# Patient Record
Sex: Female | Born: 1980 | Race: Black or African American | Hispanic: No | Marital: Married | Smoking: Never smoker
Health system: Southern US, Community
[De-identification: ages and names within clinical notes are randomized; demographics above are authoritative.]

## PROBLEM LIST (undated history)

## (undated) DIAGNOSIS — J069 Acute upper respiratory infection, unspecified: Secondary | ICD-10-CM

## (undated) DIAGNOSIS — Z789 Other specified health status: Secondary | ICD-10-CM

## (undated) DIAGNOSIS — O24419 Gestational diabetes mellitus in pregnancy, unspecified control: Secondary | ICD-10-CM

## (undated) HISTORY — DX: Acute upper respiratory infection, unspecified: J06.9

## (undated) HISTORY — DX: Other specified health status: Z78.9

---

## 2003-11-21 DIAGNOSIS — O321XX0 Maternal care for breech presentation, not applicable or unspecified: Secondary | ICD-10-CM

## 2014-12-21 NOTE — L&D Delivery Note (Addendum)
Delivery Note [phone]: Nurse call reports patient with involuntary pushing.  In room to assess and exam C/C/+3. 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 Patient encouraged to continue pushing and after assessment of vaginal outlet and discussion with husband, it was determined that labial separation was necessary for delivery.  Local anesthetic injected and ML incision of circumcision resulted in labia majora separation and patient delivered as below.  At 8:02 AM, on December 01, 2015, a viable female "Rebecca Carlson" was delivered via Vaginal, Spontaneous Delivery (Presentation: Left Occiput Anterior with restitution to LOT).  After delivery of head, nuchal cord noted that shoulders and body was delivered through via somersault maneuver.  Further assessment of infant  revealed a body cord and a loose knot. Infant with spontaneous grimace and good tone and provider gave tactile stimulation prior to placing on mother's abdomen.  Once in place, nurses continued tactile stimulation and infant  APGAR: 9, 9. Cord clamped, cut, and blood collected. Placenta delivered spontaneously and noted to be intact with 3VC upon inspection.  Vaginal inspection revealed no lacerations, but female circumcision repaired per patient's request.  Repair made with 3-0 vicryl on SH and addition of 1% lidocaine for local anesthetic. Patient tolerated procedure well and was straight catheterized prior to completion of repair.  Fundus firm, below the umbilicus, and bleeding small.  Mother hemodynamically stable and infant at the breast prior to provider exit.  Mother desires IUD for birth control method and opts to breastfeed.  Infant weight at one hour of life: 7 lb 9.2 oz (3435 g).    Anesthesia: Epidural  Episiotomy: Median Incision of Female Circumcision Lacerations: None Suture Repair: 3.0 vicryl Est. Blood Loss (mL):  120  Mom to postpartum.  Baby to Couplet care / Skin to Skin.  Cherre RobinsJessica L Kareem Aul MSN, CNM 12/01/2015, 8:43 AM

## 2015-05-30 LAB — OB RESULTS CONSOLE GC/CHLAMYDIA
Chlamydia: NEGATIVE
Gonorrhea: NEGATIVE

## 2015-05-30 LAB — OB RESULTS CONSOLE RPR: RPR: NONREACTIVE

## 2015-05-30 LAB — OB RESULTS CONSOLE HIV ANTIBODY (ROUTINE TESTING): HIV: NONREACTIVE

## 2015-05-30 LAB — OB RESULTS CONSOLE RUBELLA ANTIBODY, IGM: Rubella: IMMUNE

## 2015-05-30 LAB — OB RESULTS CONSOLE ABO/RH: RH Type: POSITIVE

## 2015-05-30 LAB — OB RESULTS CONSOLE HEPATITIS B SURFACE ANTIGEN: Hepatitis B Surface Ag: NEGATIVE

## 2015-05-30 LAB — OB RESULTS CONSOLE ANTIBODY SCREEN: ANTIBODY SCREEN: NEGATIVE

## 2015-11-07 ENCOUNTER — Encounter (HOSPITAL_COMMUNITY): Payer: Self-pay

## 2015-11-07 ENCOUNTER — Emergency Department (HOSPITAL_COMMUNITY)
Admission: EM | Admit: 2015-11-07 | Discharge: 2015-11-07 | Disposition: A | Discharge status: Home / Self Care | Payer: Medicaid Other | Attending: Emergency Medicine | Admitting: Emergency Medicine

## 2015-11-07 DIAGNOSIS — H538 Other visual disturbances: Secondary | ICD-10-CM | POA: Insufficient documentation

## 2015-11-07 DIAGNOSIS — O9989 Other specified diseases and conditions complicating pregnancy, childbirth and the puerperium: Secondary | ICD-10-CM | POA: Insufficient documentation

## 2015-11-07 DIAGNOSIS — Z79899 Other long term (current) drug therapy: Secondary | ICD-10-CM | POA: Diagnosis not present

## 2015-11-07 DIAGNOSIS — R519 Headache, unspecified: Secondary | ICD-10-CM

## 2015-11-07 DIAGNOSIS — H53149 Visual discomfort, unspecified: Secondary | ICD-10-CM | POA: Insufficient documentation

## 2015-11-07 DIAGNOSIS — R51 Headache: Secondary | ICD-10-CM | POA: Insufficient documentation

## 2015-11-07 DIAGNOSIS — O2343 Unspecified infection of urinary tract in pregnancy, third trimester: Secondary | ICD-10-CM | POA: Insufficient documentation

## 2015-11-07 DIAGNOSIS — Z3A32 32 weeks gestation of pregnancy: Secondary | ICD-10-CM | POA: Insufficient documentation

## 2015-11-07 LAB — URINE MICROSCOPIC-ADD ON

## 2015-11-07 LAB — COMPREHENSIVE METABOLIC PANEL
ALBUMIN: 2.5 g/dL — AB (ref 3.5–5.0)
ALK PHOS: 103 U/L (ref 38–126)
ALT: 10 U/L — AB (ref 14–54)
AST: 18 U/L (ref 15–41)
Anion gap: 7 (ref 5–15)
BILIRUBIN TOTAL: 0.5 mg/dL (ref 0.3–1.2)
CALCIUM: 8.6 mg/dL — AB (ref 8.9–10.3)
CO2: 20 mmol/L — AB (ref 22–32)
CREATININE: 0.55 mg/dL (ref 0.44–1.00)
Chloride: 108 mmol/L (ref 101–111)
GFR calc Af Amer: >60 mL/min (ref >60)
GFR calc non Af Amer: >60 mL/min (ref >60)
GLUCOSE: 89 mg/dL (ref 65–99)
Potassium: 3.9 mmol/L (ref 3.5–5.1)
SODIUM: 135 mmol/L (ref 135–145)
TOTAL PROTEIN: 5.6 g/dL — AB (ref 6.5–8.1)

## 2015-11-07 LAB — CBC WITH DIFFERENTIAL/PLATELET
BASOS PCT: 0 %
Basophils Absolute: 0 10*3/uL (ref 0.0–0.1)
EOS ABS: 0.1 10*3/uL (ref 0.0–0.7)
EOS PCT: 1 %
HCT: 31.1 % — ABNORMAL LOW (ref 36.0–46.0)
Hemoglobin: 10.5 g/dL — ABNORMAL LOW (ref 12.0–15.0)
Lymphocytes Relative: 25 %
Lymphs Abs: 2 10*3/uL (ref 0.7–4.0)
MCH: 29.3 pg (ref 26.0–34.0)
MCHC: 33.8 g/dL (ref 30.0–36.0)
MCV: 86.9 fL (ref 78.0–100.0)
MONO ABS: 0.5 10*3/uL (ref 0.1–1.0)
MONOS PCT: 6 %
NEUTROS ABS: 5.4 10*3/uL (ref 1.7–7.7)
Neutrophils Relative %: 68 %
PLATELETS: 171 10*3/uL (ref 150–400)
RBC: 3.58 MIL/uL — AB (ref 3.87–5.11)
RDW: 13.4 % (ref 11.5–15.5)
WBC: 7.9 10*3/uL (ref 4.0–10.5)

## 2015-11-07 LAB — URINALYSIS, ROUTINE W REFLEX MICROSCOPIC
BILIRUBIN URINE: NEGATIVE
Glucose, UA: NEGATIVE mg/dL
Hgb urine dipstick: NEGATIVE
Ketones, ur: NEGATIVE mg/dL
NITRITE: NEGATIVE
PROTEIN: NEGATIVE mg/dL
Specific Gravity, Urine: 1.006 (ref 1.005–1.030)
pH: 6.5 (ref 5.0–8.0)

## 2015-11-07 MED ORDER — METOCLOPRAMIDE HCL 5 MG/ML IJ SOLN
10.0000 mg | Freq: Once | INTRAMUSCULAR | Status: AC
  Administered 2015-11-07: 10 mg via INTRAVENOUS
  Filled 2015-11-07: qty 2

## 2015-11-07 MED ORDER — SODIUM CHLORIDE 0.9 % IV BOLUS (SEPSIS)
1000.0000 mL | Freq: Once | INTRAVENOUS | Status: AC
  Administered 2015-11-07: 1000 mL via INTRAVENOUS

## 2015-11-07 MED ORDER — PROMETHAZINE HCL 25 MG PO TABS: 12.5000 mg | ORAL_TABLET | Freq: Four times a day (QID) | ORAL | Status: DC | PRN

## 2015-11-07 MED ORDER — DIPHENHYDRAMINE HCL 50 MG/ML IJ SOLN
25.0000 mg | Freq: Once | INTRAMUSCULAR | Status: AC
  Administered 2015-11-07: 25 mg via INTRAVENOUS
  Filled 2015-11-07: qty 1

## 2015-11-07 MED ORDER — NITROFURANTOIN MONOHYD MACRO 100 MG PO CAPS: 100.0000 mg | ORAL_CAPSULE | Freq: Two times a day (BID) | ORAL | Status: DC

## 2015-11-07 NOTE — ED Notes (Signed)
Pt c/o headache most noted at right eye x 10 days. Pt is 8 months pregnant. Pt arrived from [place] 9 months ago and communicates through family per her custom.

## 2015-11-07 NOTE — Discharge Instructions (Signed)

## 2015-11-07 NOTE — ED Provider Notes (Signed)
CSN: [id number]     Arrival date & time 11/07/15  1041 History   First MD Initiated Contact with Patient 11/07/15 1057     Chief Complaint  Patient presents with  . Headache     (Consider location/radiation/quality/duration/timing/severity/associated sxs/prior Treatment) Patient is a 34 y.o. female presenting with headaches. The history is provided by the patient and a relative.  Headache Pain location:  R temporal Quality:  Dull Radiates to:  Does not radiate Severity currently:  Unable to specify Severity at highest:  Unable to specify Onset quality:  Gradual Duration:  10 days Timing:  Constant Progression:  Waxing and waning Chronicity:  Recurrent Similar to prior headaches: yes   Context: activity, bright light and loud noise   Context: not stress   Relieved by:  Nothing Worsened by:  Light, sound and activity Associated symptoms: blurred vision, facial pain, photophobia and visual change   Associated symptoms: no abdominal pain, no back pain, no congestion, no cough, no dizziness, no eye pain, no fatigue, no fever, no focal weakness, no hearing loss, no nausea, no near-syncope, no neck pain, no sore throat, no swollen glands, no syncope, no tingling, no vomiting and no weakness     History reviewed. No pertinent past medical history. Past Surgical History  Procedure Laterality Date  . Cesarean section     Family History  Problem Relation Age of Onset  . Diabetes Sister   . Hypertension Sister    Social History  Substance Use Topics  . Smoking status: None  . Smokeless tobacco: None  . Alcohol Use: No   OB History    Gravida Para Term Preterm AB TAB SAB Ectopic Multiple Living   6 4             Review of Systems  Constitutional: Negative for fever and fatigue.  HENT: Negative for congestion, facial swelling, hearing loss and sore throat.   Eyes: Positive for blurred vision and photophobia. Negative for pain.  Respiratory: Negative for cough and shortness  of breath.   Cardiovascular: Negative for chest pain, syncope and near-syncope.  Gastrointestinal: Negative for nausea, vomiting and abdominal pain.  Genitourinary: Negative for dysuria and vaginal bleeding.  Musculoskeletal: Negative for back pain and neck pain.  Skin: Negative for rash.  Neurological: Positive for headaches. Negative for dizziness, focal weakness and weakness.  Psychiatric/Behavioral: Negative for confusion.      Allergies  Review of patient's allergies indicates no known allergies.  Home Medications   Prior to Admission medications   Medication Sig Start Date End Date Taking? Authorizing Provider  cyclobenzaprine (FLEXERIL) 10 MG tablet Take 10 mg by mouth 3 (three) times daily as needed for muscle spasms.   Yes Historical Provider, MD  Prenatal Vit-Fe Fumarate-FA (MULTIVITAMIN-PRENATAL) 27-0.8 MG TABS tablet Take 1 tablet by mouth daily at 12 noon.   Yes Historical Provider, MD  nitrofurantoin, macrocrystal-monohydrate, (MACROBID) 100 MG capsule Take 1 capsule (100 mg total) by mouth 2 (two) times daily. 11/07/15   Gavin Pound, MD  promethazine (PHENERGAN) 25 MG tablet Take 0.5-1 tablets (12.5-25 mg total) by mouth every 6 (six) hours as needed for refractory nausea / vomiting (headache). 11/07/15   Gavin Pound, MD   BP 112/80 mmHg  Pulse 90  Temp(Src) 98.3 F (36.8 C) (Oral)  Ht  (1.676 m)  Wt 140 lb (63.504 kg)  BMI 22.61 kg/m2  SpO2 98%  LMP 03/03/2015 Physical Exam  Constitutional: She is oriented to person, place, and time. She appears  well-developed and well-nourished. No distress.  HENT:  Head: Normocephalic and atraumatic.  Right Ear: External ear normal.  Left Ear: External ear normal.  Nose: Nose normal.  Mouth/Throat: Oropharynx is clear and moist. No oropharyngeal exudate.  Eyes: Conjunctivae and EOM are normal. Pupils are equal, round, and reactive to light. Right eye exhibits no discharge. Left eye exhibits no discharge. No  scleral icterus.  Neck: Normal range of motion. Neck supple. No JVD present. No tracheal deviation present. No thyromegaly present.  Cardiovascular: Normal rate, regular rhythm and intact distal pulses.  Exam reveals no gallop and no friction rub.   No murmur heard. Pulmonary/Chest: Effort normal and breath sounds normal. No stridor. No respiratory distress. She has no wheezes. She has no rales. She exhibits no tenderness.  Abdominal: Soft. She exhibits no distension. There is no tenderness.  Musculoskeletal: Normal range of motion. She exhibits no edema or tenderness.  Lymphadenopathy:    She has no cervical adenopathy.  Neurological: She is alert and oriented to person, place, and time. She has normal strength. She displays no atrophy and no tremor. No cranial nerve deficit or sensory deficit. She exhibits normal muscle tone. She displays no seizure activity. Coordination and gait normal. GCS eye subscore is 4. GCS verbal subscore is 5. GCS motor subscore is 6.  Skin: Skin is warm and dry. No rash noted. She is not diaphoretic. No erythema. No pallor.  Psychiatric: She has a normal mood and affect. Her behavior is normal. Judgment and thought content normal.  Nursing note and vitals reviewed.   ED Course  Procedures (including critical care time) Labs Review Labs Reviewed  COMPREHENSIVE METABOLIC PANEL - Abnormal; Notable for the following:    CO2 20 (*)    BUN <5 (*)    Calcium 8.6 (*)    Total Protein 5.6 (*)    Albumin 2.5 (*)    ALT 10 (*)    All other components within normal limits  CBC WITH DIFFERENTIAL/PLATELET - Abnormal; Notable for the following:    RBC 3.58 (*)    Hemoglobin 10.5 (*)    HCT 31.1 (*)    All other components within normal limits  URINALYSIS, ROUTINE W REFLEX MICROSCOPIC (NOT AT [facility]) - Abnormal; Notable for the following:    Leukocytes, UA TRACE (*)    All other components within normal limits  URINE MICROSCOPIC-ADD ON - Abnormal; Notable for the  following:    Squamous Epithelial / LPF 6-30 (*)    Bacteria, UA MANY (*)    All other components within normal limits    Imaging Review No results found. I have personally reviewed and evaluated these images and lab results as part of my medical decision-making.   EKG Interpretation None      MDM   Final diagnoses:  Nonintractable headache, unspecified chronicity pattern, unspecified headache type  Urinary tract infection during pregnancy, third trimester   Rebecca Carlson is a 34 y.o. female patient presenting with HA x 10 days.  Pt denies any other complaints at this time.  Pt is not sure of exact pregnancy dates but is due in December.  Currently receiving regular prenatal care.  Pt is a G5P4.  Pt has hx of prior HAs.  This one has been more prolonged and has not responded to flexeril, which she received from PCP.  No focal neurologic deficits.  No HTN.  No RUQ TTP.  Pt was given MG cocktail with resolution of her symptoms.  Pt has  bactiuria on UA.  No significant lab abnormalities other than mild reduction in bicarbonate.  No significant acidosis, no AG, normal blood glucose.  Will Rx for phenergan for refractory HA.  Also macrobid BID x 7 days.  Patient was given return precautions for HA and UTI in pregnancy.  Pt advised on use of medications as applicable.  Advised to return for actely worsening symptoms, inability to take medications, or other acute concerns.  Advised to follow up with primary OB in 1 week.  Patient was in agreement with and expressed understanding of follow plan, plan of care, and return precautions.  All questions answered prior to discharge.  Patient was discharged in stable condition with family, ambulating without difficulty.  Patient care was discussed with my attending, Dr. Patria Mane.       Gavin Pound, MD 11/08/15 1610  Azalia Bilis, MD 11/08/15 [phone]

## 2015-11-11 LAB — OB RESULTS CONSOLE GBS: STREP GROUP B AG: NEGATIVE

## 2015-11-11 LAB — OB RESULTS CONSOLE GC/CHLAMYDIA
CHLAMYDIA, DNA PROBE: NEGATIVE
Gonorrhea: NEGATIVE

## 2015-12-01 ENCOUNTER — Inpatient Hospital Stay (HOSPITAL_COMMUNITY)
Admission: AD | Admit: 2015-12-01 | Discharge: 2015-12-03 | DRG: 775 | Disposition: A | Discharge status: Home / Self Care | Payer: Medicaid Other | Source: Ambulatory Visit | Attending: Obstetrics and Gynecology | Admitting: Obstetrics and Gynecology

## 2015-12-01 ENCOUNTER — Inpatient Hospital Stay (HOSPITAL_COMMUNITY): Payer: Medicaid Other | Admitting: Anesthesiology

## 2015-12-01 ENCOUNTER — Encounter (HOSPITAL_COMMUNITY): Payer: Self-pay | Admitting: *Deleted

## 2015-12-01 DIAGNOSIS — O3483 Maternal care for other abnormalities of pelvic organs, third trimester: Secondary | ICD-10-CM | POA: Diagnosis present

## 2015-12-01 DIAGNOSIS — Z98891 History of uterine scar from previous surgery: Secondary | ICD-10-CM

## 2015-12-01 DIAGNOSIS — O34219 Maternal care for unspecified type scar from previous cesarean delivery: Secondary | ICD-10-CM | POA: Diagnosis present

## 2015-12-01 DIAGNOSIS — Z3A39 39 weeks gestation of pregnancy: Secondary | ICD-10-CM | POA: Diagnosis not present

## 2015-12-01 DIAGNOSIS — N90810 Female genital mutilation status, unspecified: Secondary | ICD-10-CM | POA: Diagnosis present

## 2015-12-01 LAB — CBC
HEMATOCRIT: 36.2 % (ref 36.0–46.0)
HEMOGLOBIN: 12.2 g/dL (ref 12.0–15.0)
MCH: 29.3 pg (ref 26.0–34.0)
MCHC: 33.7 g/dL (ref 30.0–36.0)
MCV: 87 fL (ref 78.0–100.0)
Platelets: 177 10*3/uL (ref 150–400)
RBC: 4.16 MIL/uL (ref 3.87–5.11)
RDW: 14.3 % (ref 11.5–15.5)
WBC: 9.3 10*3/uL (ref 4.0–10.5)

## 2015-12-01 LAB — RPR: RPR: NONREACTIVE

## 2015-12-01 LAB — TYPE AND SCREEN
ABO/RH(D): A POS
ANTIBODY SCREEN: NEGATIVE

## 2015-12-01 LAB — ABO/RH: ABO/RH(D): A POS

## 2015-12-01 MED ORDER — ONDANSETRON HCL 4 MG/2ML IJ SOLN: 4.0000 mg | INTRAMUSCULAR | Status: DC | PRN

## 2015-12-01 MED ORDER — ZOLPIDEM TARTRATE 5 MG PO TABS: 5.0000 mg | ORAL_TABLET | Freq: Every evening | ORAL | Status: DC | PRN

## 2015-12-01 MED ORDER — EPHEDRINE 5 MG/ML INJ
10.0000 mg | INTRAVENOUS | Status: DC | PRN
  Filled 2015-12-01: qty 2

## 2015-12-01 MED ORDER — PHENYLEPHRINE 40 MCG/ML (10ML) SYRINGE FOR IV PUSH (FOR BLOOD PRESSURE SUPPORT)
PREFILLED_SYRINGE | INTRAVENOUS | Status: AC
  Filled 2015-12-01: qty 20

## 2015-12-01 MED ORDER — TETANUS-DIPHTH-ACELL PERTUSSIS 5-2.5-18.5 LF-MCG/0.5 IM SUSP: 0.5000 mL | Freq: Once | INTRAMUSCULAR | Status: DC

## 2015-12-01 MED ORDER — LACTATED RINGERS IV SOLN
500.0000 mL | INTRAVENOUS | Status: DC | PRN
  Administered 2015-12-01: 500 mL via INTRAVENOUS

## 2015-12-01 MED ORDER — ACETAMINOPHEN 325 MG PO TABS: 650.0000 mg | ORAL_TABLET | ORAL | Status: DC | PRN

## 2015-12-01 MED ORDER — SIMETHICONE 80 MG PO CHEW: 80.0000 mg | CHEWABLE_TABLET | ORAL | Status: DC | PRN

## 2015-12-01 MED ORDER — DIBUCAINE 1 % RE OINT: 1.0000 "application " | TOPICAL_OINTMENT | RECTAL | Status: DC | PRN

## 2015-12-01 MED ORDER — OXYCODONE-ACETAMINOPHEN 5-325 MG PO TABS: 2.0000 | ORAL_TABLET | ORAL | Status: DC | PRN

## 2015-12-01 MED ORDER — PRENATAL MULTIVITAMIN CH
1.0000 | ORAL_TABLET | Freq: Every day | ORAL | Status: DC
  Administered 2015-12-02 – 2015-12-03 (×2): 1 via ORAL
  Filled 2015-12-01 (×2): qty 1

## 2015-12-01 MED ORDER — ONDANSETRON HCL 4 MG/2ML IJ SOLN: 4.0000 mg | Freq: Four times a day (QID) | INTRAMUSCULAR | Status: DC | PRN

## 2015-12-01 MED ORDER — PHENYLEPHRINE 40 MCG/ML (10ML) SYRINGE FOR IV PUSH (FOR BLOOD PRESSURE SUPPORT)
80.0000 ug | PREFILLED_SYRINGE | INTRAVENOUS | Status: DC | PRN
  Filled 2015-12-01: qty 2

## 2015-12-01 MED ORDER — DIPHENHYDRAMINE HCL 25 MG PO CAPS: 25.0000 mg | ORAL_CAPSULE | Freq: Four times a day (QID) | ORAL | Status: DC | PRN

## 2015-12-01 MED ORDER — SENNOSIDES-DOCUSATE SODIUM 8.6-50 MG PO TABS
2.0000 | ORAL_TABLET | ORAL | Status: DC
  Administered 2015-12-01 – 2015-12-02 (×2): 2 via ORAL
  Filled 2015-12-01 (×2): qty 2

## 2015-12-01 MED ORDER — CITRIC ACID-SODIUM CITRATE 334-500 MG/5ML PO SOLN: 30.0000 mL | ORAL | Status: DC | PRN

## 2015-12-01 MED ORDER — LANOLIN HYDROUS EX OINT: TOPICAL_OINTMENT | CUTANEOUS | Status: DC | PRN

## 2015-12-01 MED ORDER — FENTANYL 2.5 MCG/ML BUPIVACAINE 1/10 % EPIDURAL INFUSION (WH - ANES)
14.0000 mL/h | INTRAMUSCULAR | Status: DC | PRN
  Administered 2015-12-01: 12.5 mL/h via EPIDURAL

## 2015-12-01 MED ORDER — OXYTOCIN BOLUS FROM INFUSION
500.0000 mL | INTRAVENOUS | Status: DC
  Administered 2015-12-01: 500 mL via INTRAVENOUS

## 2015-12-01 MED ORDER — BENZOCAINE-MENTHOL 20-0.5 % EX AERO
1.0000 "application " | INHALATION_SPRAY | CUTANEOUS | Status: DC | PRN
  Filled 2015-12-01: qty 56

## 2015-12-01 MED ORDER — OXYCODONE-ACETAMINOPHEN 5-325 MG PO TABS: 1.0000 | ORAL_TABLET | ORAL | Status: DC | PRN

## 2015-12-01 MED ORDER — LIDOCAINE HCL (PF) 1 % IJ SOLN
30.0000 mL | INTRAMUSCULAR | Status: AC | PRN
  Administered 2015-12-01: 30 mL via SUBCUTANEOUS
  Filled 2015-12-01: qty 30

## 2015-12-01 MED ORDER — FENTANYL 2.5 MCG/ML BUPIVACAINE 1/10 % EPIDURAL INFUSION (WH - ANES)
INTRAMUSCULAR | Status: AC
  Filled 2015-12-01: qty 125

## 2015-12-01 MED ORDER — WITCH HAZEL-GLYCERIN EX PADS: 1.0000 "application " | MEDICATED_PAD | CUTANEOUS | Status: DC | PRN

## 2015-12-01 MED ORDER — DIPHENHYDRAMINE HCL 50 MG/ML IJ SOLN: 12.5000 mg | INTRAMUSCULAR | Status: DC | PRN

## 2015-12-01 MED ORDER — FLEET ENEMA 7-19 GM/118ML RE ENEM: 1.0000 | ENEMA | RECTAL | Status: DC | PRN

## 2015-12-01 MED ORDER — OXYTOCIN 40 UNITS IN LACTATED RINGERS INFUSION - SIMPLE MED
62.5000 mL/h | INTRAVENOUS | Status: DC
  Filled 2015-12-01: qty 1000

## 2015-12-01 MED ORDER — LACTATED RINGERS IV SOLN
INTRAVENOUS | Status: DC
  Administered 2015-12-01: 04:00:00 via INTRAVENOUS

## 2015-12-01 MED ORDER — IBUPROFEN 600 MG PO TABS
600.0000 mg | ORAL_TABLET | Freq: Four times a day (QID) | ORAL | Status: DC
  Administered 2015-12-01 – 2015-12-03 (×8): 600 mg via ORAL
  Filled 2015-12-01 (×8): qty 1

## 2015-12-01 MED ORDER — ONDANSETRON HCL 4 MG PO TABS
4.0000 mg | ORAL_TABLET | ORAL | Status: DC | PRN
Start: 2015-12-01 — End: 2015-12-03

## 2015-12-01 MED ORDER — LIDOCAINE HCL (PF) 1 % IJ SOLN
INTRAMUSCULAR | Status: DC | PRN
  Administered 2015-12-01: 3 mL via EPIDURAL
  Administered 2015-12-01: 4 mL via EPIDURAL

## 2015-12-01 NOTE — Progress Notes (Signed)
Subjective: C/o increasing pressure but does not have the urge to push.   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

## 2015-12-01 NOTE — Progress Notes (Signed)
Rebecca Carlson, CNM notified of RN's inability to locate urethra for I/O cath insertion. Bladder assessed and is not distended. Pt last voided at 0420

## 2015-12-01 NOTE — MAU Note (Signed)
Contractions since 0200. Denies LOF or bleeding. First del C/S and the rest SVDs

## 2015-12-01 NOTE — Progress Notes (Signed)
Pt arrived safely via w/c from MAU to 163. Pt ambulated to BR safely, void of urine noted. Pt assisted to bed, EFM applied to soft abd, FHR located in LLQ at 145 BPM. Pt's accompanied by Spouse and does not speak [place]. Spouse will interpret per pt's request and interpreter consent has been signed by spouse, Osama Alvadol.

## 2015-12-01 NOTE — Progress Notes (Signed)
Subjective: Feeling nauseous but feeling more comfortable with epidural. Does not c/o pressure at this time.   Objective: BP 113/71 mmHg  Pulse 81  Temp(Src) 98.2 F (36.8 C) (Oral)  Resp 18  Ht 5' 2.5" (1.588 m)  Wt 56.518 kg (124 lb 9.6 oz)  BMI 22.41 kg/m2  SpO2 99%      FHT:  Overall Cat 1 currently Cat 2  S/p Epidural, 135 bpm, minimal variability, no accels, no decels  UC:   regular, every 2 minutes SVE:   Dilation: Lip/rim Effacement (%): 80 Station: 0 Exam by:: R. 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

## 2015-12-01 NOTE — Anesthesia Procedure Notes (Signed)
Epidural Patient location during procedure: OB Start time: 12/01/2015 5:13 AM  Staffing Anesthesiologist: Mal AmabileFOSTER, Brok Stocking  Preanesthetic Checklist Completed: patient identified, site marked, surgical consent, pre-op evaluation, timeout performed, IV checked, risks and benefits discussed and monitors and equipment checked  Epidural Patient position: sitting Prep: site prepped and draped and DuraPrep Patient monitoring: continuous pulse ox and blood pressure Approach: midline Injection technique: LOR air  Needle:  Needle type: Tuohy  Needle gauge: 17 G Needle length: 9 cm and 9 Needle insertion depth: 4 cm Catheter type: closed end flexible Catheter size: 19 Gauge Catheter at skin depth: 9 cm Test dose: negative and Other  Assessment Events: blood not aspirated, injection not painful, no injection resistance, negative IV test and no paresthesia  Additional Notes Patient identified. Risks and benefits discussed including failed block, incomplete  Pain control, post dural puncture headache, nerve damage, paralysis, blood pressure Changes, nausea, vomiting, reactions to medications-both toxic and allergic and post Partum back pain. All questions were answered. Patient expressed understanding and wished to proceed. Sterile technique was used throughout procedure. Epidural site was Dressed with sterile barrier dressing. No paresthesias, signs of intravascular injection Or signs of intrathecal spread were encountered.  Patient was more comfortable after the epidural was dosed. Please see RN's note for documentation of vital signs and FHR which are stable.

## 2015-12-01 NOTE — Progress Notes (Signed)
Report given and care relinquished P. Christell ConstantMoore, RN.

## 2015-12-01 NOTE — Progress Notes (Signed)
RN attempts to insert I/O cath and unable to visualize urethra due to female circumcision. CNM to be notified.

## 2015-12-01 NOTE — H&P (Signed)
Rebecca Carlson is a 34 y.o. female, G5P4 at 39.0 weeks, presenting for contractions.   Reports contractions beginning at 0200 this morning, every 15 min. Contractions now 2-5 min apart.  Denies LOf or vaginal bleeding.  +FM.  History of cesarean section  In her first pregnancy due to breech presentation.  All other deliveries have been vaginal  ( VBAC x 3).   Largest baby  7 lbs 8oz cn 2007.   Pt is from [place], speaks Arabic.  Husband translates.   There are no active problems to display for this patient.   History of present pregnancy: Patient entered care at 13.4 weeks.   EDC of 12/08/15 was established by LMP.   Anatomy scan:  21.1 weeks, with normal findings and an posterior placenta.  Vertex, No previa, Placenta edge to cx= 6.3cm. Placental Cord Insertion seen. Normal fluid. AP pocket = 4.3 cm. Measurements are concordant with LMP GA . Anatomy: open hands, 5th digit, NB, AA, and DA seen. Female gender. Cx closed. Adnexa unremarkable. Ovaries not seen.  Additional US evaluations:  11/20/15 : Mason JimSingleton pregnancy. Vertex presentation. Posterior placenta. AFI 55th. Cervix not measured.   Significant prenatal events:  First Trimester: none Second Trimester:  C/o UTI and tooth pain, referred to Dentist Third Trimester  :  C/o Sciatica, headache, chronic UTIs in pregnancy Last evaluation:   11/27/15  S. Alferd Apaevane johnson, CNM    FH 38 cm, vertex, FHR 140 bpm,    BP 98/62    128.25 lbs   OB History    Gravida Para Term Preterm AB TAB SAB Ectopic Multiple Living   5 4 4       4     11/21/2003     40 wks  Cesarean Section - breech   6lbs 12/28/2005       40 wks    SVD     7 lbs   8 oz 02/19/2009       40 wks    SVD      6 lbs   09/09/2011        40 wks    SVD      6lbs     History reviewed. No pertinent past medical history. Past Surgical History  Procedure Laterality Date  . Cesarean section     Family History: family history is not on file. Social History:  reports that she has never smoked.  She does not have any smokeless tobacco history on file. She reports that she does not drink alcohol or use illicit drugs.  Country of Origin is [place]. Speaks Arabic, limited [place].  Is married, husband translates. Works as a Futures traderhomemaker.   Prenatal Transfer Tool  Maternal Diabetes: No Genetic Screening: Normal Maternal Ultrasounds/Referrals: Normal Fetal Ultrasounds or other Referrals:  None Maternal Substance Abuse:  No Significant Maternal Medications:  None Significant Maternal Lab Results: Lab values include: Group B Strep negative  TDAP 10/14/15 Flu 10/14/15  ROS:  No lof, No vB, contraction q 2-184min. + FM   No Known Allergies   Dilation: 5 Effacement (%): 80 Station: Ballotable Exam by:: Fleet Contrasachel CNM  Blood pressure 121/72, pulse 100, temperature 98 F (36.7 C), resp. rate 18, height 5' 2.5" (1.588 m), weight 56.518 kg (124 lb 9.6 oz).  Chest clear Heart RRR without murmur Abd gravid, NT, FH 38 cm on  11/27/15 Pelvic: Proven to 7 lbs 8 oz,  Ext: wnl  FHR: Category 1, 135 bpm, moderate variability, +Accels, no decels UCs:  Regular, every 2-4 min, palpate strong  Prenatal labs: ABO, Rh: A/Positive/-- (06/09 0000)  Antibody: Negative (06/09 0000)   Rubella:  !Error!   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 Normand Sloop VBAC consent signed Expectant  Management Routine [facility] orders Pain med/epidural prn  Beatrix Fetters, [place] 12/01/2015, 4:22 AM

## 2015-12-01 NOTE — Anesthesia Preprocedure Evaluation (Signed)
Anesthesia Evaluation  Patient identified by MRN, date of birth, ID band Patient awake    Reviewed: Allergy & Precautions, Patient's Chart, lab work & pertinent test results  Airway Mallampati: II  TM Distance: >3 FB Neck ROM: Full    Dental no notable dental hx. (+) Teeth Intact   Pulmonary neg pulmonary ROS,    Pulmonary exam normal breath sounds clear to auscultation       Cardiovascular negative cardio ROS Normal cardiovascular exam Rhythm:Regular Rate:Normal     Neuro/Psych negative neurological ROS  negative psych ROS   GI/Hepatic negative GI ROS, Neg liver ROS,   Endo/Other  negative endocrine ROS  Renal/GU negative Renal ROS  negative genitourinary   Musculoskeletal   Abdominal   Peds  Hematology negative hematology ROS (+)   Anesthesia Other Findings   Reproductive/Obstetrics Previous C/Section                             Anesthesia Physical Anesthesia Plan  ASA: II  Anesthesia Plan: Epidural   Post-op Pain Management:    Induction:   Airway Management Planned: Natural Airway  Additional Equipment:   Intra-op Plan:   Post-operative Plan:   Informed Consent: I have reviewed the patients History and Physical, chart, labs and discussed the procedure including the risks, benefits and alternatives for the proposed anesthesia with the patient or authorized representative who has indicated his/her understanding and acceptance.     Plan Discussed with: Anesthesiologist  Anesthesia Plan Comments:         Anesthesia Quick Evaluation

## 2015-12-01 NOTE — Progress Notes (Signed)
Dr. Malen GauzeFoster of anesthesia to bedside, informed consent presented including risks and benefits using spouse as interpreter. Pt agrees to procedure, consent signed, pt assisted to sitting position on side of bed. Cat I FHR surveillance noted, EFM removed for procedure.

## 2015-12-01 NOTE — Progress Notes (Addendum)
Subjective: Pt extremely uncomfortable with contractions.  Requesting epidural/ pain medication.   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

## 2015-12-01 NOTE — Progress Notes (Signed)
Epidural procedure completed. Pt assisted to low-fowlers with left tilt for uterine displacement. EFM re-applied to soft abd. FHR located in RLQ at 140 BPM. Mobility instructions R/T epidural cath reviewed, pt verbalizes understanding and can return correct demo.    

## 2015-12-02 LAB — CBC
HEMATOCRIT: 33.2 % — AB (ref 36.0–46.0)
HEMOGLOBIN: 11.1 g/dL — AB (ref 12.0–15.0)
MCH: 29 pg (ref 26.0–34.0)
MCHC: 33.4 g/dL (ref 30.0–36.0)
MCV: 86.7 fL (ref 78.0–100.0)
Platelets: 171 10*3/uL (ref 150–400)
RBC: 3.83 MIL/uL — ABNORMAL LOW (ref 3.87–5.11)
RDW: 14.7 % (ref 11.5–15.5)
WBC: 13.8 10*3/uL — AB (ref 4.0–10.5)

## 2015-12-02 NOTE — Lactation Note (Signed)
This note was copied from the chart of Rebecca Salem CasterFarida Portnoy. Lactation Consultation Note Initial visit at 32 hours of age.  Mom speaks arabic and FOB interprets for her.  Mom reports several good feedings.  FOB reports they had asked for formula but now baby is getting moms milk so they don't think they will need it.  Encouraged parents to offer breastmilk exclusively as long as they can to increase benefits of breastmilk. Mom has previous experience with 3 older children.  [facility].  Baby STS and mom is in semi side lying leaning forward.  Encouraged mom to alternate positions and try holding baby up to her for comfort at feedings.  Mom is able to hand express a few drops after feeding, encouraged to rub into nipples.  [facility] given and discussed.  Encouraged to feed baby 8-12/ 24 hours.  Encouraged FOB to record feedings to facilitate discharge.   Early newborn behavior discussed.  Mom to call for assist as needed.    Patient Name: Rebecca Carlson ZOXWR'UToday's Date: 12/02/2015 Reason for consult: Initial assessment   Maternal Data Has patient been taught Hand Expression?: Yes Does the patient have breastfeeding experience prior to this delivery?: Yes  Feeding Feeding Type: Breast Fed Length of feed: 10 min (10)  LATCH Score/Interventions Latch: Grasps breast easily, tongue down, lips flanged, rhythmical sucking. Intervention(s): Adjust position;Assist with latch;Breast massage;Breast compression  Audible Swallowing: A few with stimulation Intervention(s): Skin to skin;Hand expression  Type of Nipple: Everted at rest and after stimulation  Comfort (Breast/Nipple): Soft / non-tender     Hold (Positioning): No assistance needed to correctly position infant at breast. Intervention(s): Breastfeeding basics reviewed;Skin to skin  LATCH Score: 9  Lactation Tools Discussed/Used WIC Program: Yes   Consult Status Consult Status:  Follow-up Date: 12/03/15 Follow-up type: In-patient    Beverely RisenShoptaw, Arvella MerlesJana Lynn 12/02/2015, 4:37 PM

## 2015-12-02 NOTE — Anesthesia Postprocedure Evaluation (Signed)
Anesthesia Post Note  Patient: Rebecca Carlson  Procedure(s) Performed: * No procedures listed *  Patient location during evaluation: Mother Baby Anesthesia Type: Epidural Level of consciousness: awake Pain management: satisfactory to patient Vital Signs Assessment: post-procedure vital signs reviewed and stable Respiratory status: spontaneous breathing Cardiovascular status: stable Anesthetic complications: no    Last Vitals:  Filed Vitals:   12/01/15 1030 12/01/15 1847  BP: 117/69 111/56  Pulse: 70 68  Temp:  37.1 C  Resp: 20 18    Last Pain:  Filed Vitals:   12/02/15 0654  PainSc: 5                  Maclain Cohron

## 2015-12-02 NOTE — Progress Notes (Addendum)
Rebecca Carlson   Subjective: Post Partum Day 1 Vaginal delivery (VBAC), Median Incision of Female Circumcision Patient up ad lib, denies syncope or dizziness. Reports consuming regular diet without issues and denies N/V No issues with urination and reports bleeding is appropriate  Feeding:   breast Contraceptive plan:   IUD  Objective: Temp:  [98.8 F (37.1 C)] 98.8 F (37.1 C) (12/11 1847) Pulse Rate:  [62-70] 68 (12/11 1847) Resp:  [18-20] 18 (12/11 1847) BP: (108-132)/(56-92) 111/56 mmHg (12/11 1847)  Physical Exam:  General: alert and cooperative Ext: WNL, no significant  edema. No evidence of DVT seen on physical exam. Breast: Soft filling Lungs: CTAB Heart RRR without murmur  Abdomen:  Soft, fundus firm, lochia scant, + bowel sounds, non distended, non tender Lochia: appropriate Uterine Fundus: firm Laceration: healing well    Recent Labs  12/01/15 0358 12/02/15 0617  HGB 12.2 11.1*  HCT 36.2 33.2*    Assessment S/P Vaginal Delivery-Day 1 Stable  Normal Involution Breastfeeding   Plan: Continue current care Plan for discharge tomorrow and Lactation consult Lactation support   Rebecca Carlson, CNM, MSN 12/02/2015, 8:34 AM

## 2015-12-03 DIAGNOSIS — Z98891 History of uterine scar from previous surgery: Secondary | ICD-10-CM

## 2015-12-03 DIAGNOSIS — O34219 Maternal care for unspecified type scar from previous cesarean delivery: Secondary | ICD-10-CM | POA: Diagnosis not present

## 2015-12-03 HISTORY — DX: History of uterine scar from previous surgery: Z98.891

## 2015-12-03 MED ORDER — IBUPROFEN 600 MG PO TABS: 600.0000 mg | ORAL_TABLET | Freq: Four times a day (QID) | ORAL | Status: DC | PRN

## 2015-12-03 NOTE — Progress Notes (Signed)
Discharge instructions discussed with patient with husband as interpreter.  Pt denies any questions or concerns at this time.

## 2015-12-03 NOTE — Discharge Summary (Signed)
OB Discharge Summary     Patient Name: Rebecca Carlson DOB: 04/14/1981 MRN: [id number]  Date of admission: 12/01/2015 Delivering MD: Gerrit HeckEMLY, JESSICA   Date of discharge: 12/03/2015  Admitting diagnosis: PREG Intrauterine pregnancy: 9540w0d     Secondary diagnosis:  Active Problems:   Normal labor   Female circumcision   Previous cesarean section--subsequent VBAC x 3   VBAC (vaginal birth after Cesarean)  Additional problems: None     Discharge diagnosis: VBAC                                                                                                Post partum procedures:None  Augmentation: None  Complications: None  Hospital course:  Onset of Labor With Vaginal Delivery     34 y.o. yo G5P5001 at 1040w0d was admitted in Active Laboron 12/01/2015. Patient had an uncomplicated labor course as follows:  Membrane Rupture Time/Date: 5:30 AM ,12/01/2015   Intrapartum Procedures: Episiotomy: ML incision of female circumcision area                                        Lacerations:  None [1]  Patient had a delivery of a Viable infant.  Area of labia majora incision repaired per patient and husband request. 12/01/2015  Information for the patient's newborn:  Izora Ribassmael, Girl Uyen [[id number]][[id number]]  Delivery Method: VBAC, Spontaneous (Filed from Delivery Summary)    Pateint had an uncomplicated postpartum course.  She is ambulating, tolerating a regular diet, passing flatus, and urinating well. Patient is discharged home in stable condition on 12/03/15.  Physical exam  Filed Vitals:   12/01/15 1030 12/01/15 1847 12/02/15 1839 12/03/15 0550  BP: 117/69 111/56 91/51 96/56   Pulse: 70 68 68 65  Temp:  98.8 F (37.1 C) 98.1 F (36.7 C) 98 F (36.7 C)  TempSrc:  Oral Oral Oral  Resp: 20 18 18 16   Height:      Weight:      SpO2:       General: alert Lochia: appropriate Uterine Fundus: firm Incision: Healing well with no significant drainage DVT Evaluation: No evidence of DVT seen on  physical exam. Negative Homan's sign. Labs: CBC Latest Ref Rng 12/02/2015 12/01/2015  WBC 4.0 - 10.5 K/uL 13.8(H) 9.3  Hemoglobin 12.0 - 15.0 g/dL 11.1(L) 12.2  Hematocrit 36.0 - 46.0 % 33.2(L) 36.2  Platelets 150 - 400 K/uL 171 177     Discharge instruction: per After Visit Summary and "Baby and Me Booklet".  After visit meds:    Medication List    TAKE these medications        ibuprofen 600 MG tablet  Commonly known as:  ADVIL,MOTRIN  Take 1 tablet (600 mg total) by mouth every 6 (six) hours as needed.     prenatal multivitamin Tabs tablet  Take 1 tablet by mouth daily at 12 noon.        Diet: routine diet  Activity: Advance as tolerated. Pelvic rest for 6 weeks.   Outpatient follow  up:5 weeks, then at 6 weeks for Mirena Follow up Appt:No future appointments. Follow up Visit:No Follow-up on file.  Postpartum contraception: IUD Mirena at 6 weeks.  Newborn Data: Live born female  Birth Weight: 7 lb 9.2 oz (3435 g) APGAR: 9, 9  Baby Feeding: Breast Disposition:home with mother   12/03/2015 Nigel Bridgeman, CNM

## 2015-12-03 NOTE — Lactation Note (Signed)
This note was copied from the chart of Rebecca Carlson. Lactation Consultation Note: Dad translated for me. Reports breastfeeding is going very well-with no pain. Reports making more milk today than yesterday. Experienced BF mom Last LS 9 by RN. Baby asleep at mom's side. No questions at present. To call prn  Patient Name: Rebecca Carlson EXBMW'UToday's Date: 12/03/2015 Reason for consult: Follow-up assessment   Maternal Data Formula Feeding for Exclusion: No Does the patient have breastfeeding experience prior to this delivery?: Yes  Feeding   LATCH Score/Interventions   Lactation Tools Discussed/Used     Consult Status Consult Status: Complete    Pamelia HoitWeeks, Deron Poole D 12/03/2015, 8:13 AM

## 2015-12-03 NOTE — Discharge Instructions (Signed)

## 2016-05-27 ENCOUNTER — Encounter (HOSPITAL_COMMUNITY): Payer: Self-pay | Admitting: *Deleted

## 2019-08-02 ENCOUNTER — Encounter (HOSPITAL_COMMUNITY): Payer: Self-pay | Admitting: Emergency Medicine

## 2019-08-02 ENCOUNTER — Other Ambulatory Visit: Payer: Self-pay

## 2019-08-02 ENCOUNTER — Ambulatory Visit (HOSPITAL_COMMUNITY)
Admission: EM | Admit: 2019-08-02 | Discharge: 2019-08-02 | Disposition: A | Discharge status: Home / Self Care | Payer: Self-pay | Attending: Urgent Care | Admitting: Urgent Care

## 2019-08-02 DIAGNOSIS — R002 Palpitations: Secondary | ICD-10-CM

## 2019-08-02 DIAGNOSIS — J3089 Other allergic rhinitis: Secondary | ICD-10-CM

## 2019-08-02 DIAGNOSIS — R42 Dizziness and giddiness: Secondary | ICD-10-CM

## 2019-08-02 MED ORDER — MECLIZINE HCL 12.5 MG PO TABS: 12.5000 mg | ORAL_TABLET | Freq: Two times a day (BID) | ORAL | 0 refills | Status: DC | PRN

## 2019-08-02 MED ORDER — CETIRIZINE HCL 10 MG PO TABS: 10.0000 mg | ORAL_TABLET | Freq: Every day | ORAL | 0 refills | Status: DC

## 2019-08-02 NOTE — ED Provider Notes (Signed)
MRN: [id number] DOB: 10/01/1981  Subjective:   Rebecca Carlson is a 38 y.o. female presenting for 1 day history of intermittent dizziness. Feels rooms is spinning when she closes her eyes and improves somewhat when she has her eyes open. Has also had heart racing, palpitations. Denies history of heart disease, conditions. Denies smoking cigarettes or drinking alcohol.  She is not currently taking any medications.   No Known Allergies  Denies pmh.    Past Surgical History:  Procedure Laterality Date  . CESAREAN SECTION    . CESAREAN SECTION     Review of Systems  Constitutional: Negative for fever and malaise/fatigue.  HENT: Negative for congestion, ear pain, sinus pain and sore throat.   Eyes: Negative for blurred vision, double vision, discharge and redness.  Respiratory: Negative for cough, hemoptysis, shortness of breath and wheezing.   Cardiovascular: Positive for palpitations. Negative for chest pain.  Gastrointestinal: Negative for abdominal pain, diarrhea, nausea and vomiting.  Genitourinary: Negative for dysuria, flank pain and hematuria.  Musculoskeletal: Negative for myalgias.  Skin: Negative for rash.  Neurological: Positive for dizziness. Negative for weakness and headaches.  Psychiatric/Behavioral: Negative for depression and substance abuse.    Objective:   Vitals: BP 126/79 (BP Location: Left Arm)   Pulse 84   Temp 98.1 F (36.7 C) (Temporal)   Resp 18   SpO2 100%   Breastfeeding Unknown   Physical Exam Constitutional:      General: She is not in acute distress.    Appearance: Normal appearance. She is well-developed. She is not ill-appearing, toxic-appearing or diaphoretic.  HENT:     Head: Normocephalic and atraumatic.     Right Ear: Tympanic membrane, ear canal and external ear normal. No drainage or tenderness. No middle ear effusion. There is no impacted cerumen. Tympanic membrane is not erythematous.     Left Ear: Tympanic membrane, ear  canal and external ear normal. No drainage or tenderness.  No middle ear effusion. There is no impacted cerumen. Tympanic membrane is not erythematous.     Nose: Nose normal. No congestion or rhinorrhea.     Mouth/Throat:     Mouth: Mucous membranes are moist. No oral lesions.     Pharynx: No pharyngeal swelling, oropharyngeal exudate, posterior oropharyngeal erythema or uvula swelling.     Tonsils: No tonsillar exudate or tonsillar abscesses.     Comments: Mild postnasal drainage. Eyes:     Extraocular Movements: Extraocular movements intact.     Right eye: Normal extraocular motion.     Left eye: Normal extraocular motion.     Conjunctiva/sclera: Conjunctivae normal.     Pupils: Pupils are equal, round, and reactive to light.  Neck:     Musculoskeletal: Normal range of motion and neck supple.  Cardiovascular:     Rate and Rhythm: Normal rate and regular rhythm.     Pulses: Normal pulses.     Heart sounds: Normal heart sounds. No murmur. No friction rub. No gallop.   Pulmonary:     Effort: Pulmonary effort is normal. No respiratory distress.     Breath sounds: Normal breath sounds. No stridor. No wheezing, rhonchi or rales.  Lymphadenopathy:     Cervical: No cervical adenopathy.  Skin:    General: Skin is warm and dry.     Findings: No rash.  Neurological:     General: No focal deficit present.     Mental Status: She is alert and oriented to person, place, and time.  Psychiatric:  Mood and Affect: Mood normal.        Behavior: Behavior normal.        Thought Content: Thought content normal.        Judgment: Judgment normal.    ED ECG REPORT   Date: 08/02/2019  Rate: 80bpm  Rhythm: normal sinus rhythm  QRS Axis: normal  Intervals: normal  ST/T Wave abnormalities: normal  Conduction Disutrbances:none  Narrative Interpretation: Sinus rhythm at 80 bpm.  Old EKG Reviewed: none available  I have personally reviewed the EKG tracing and agree with the computerized  printout as noted.   Assessment and Plan :   1. Vertigo   2. Dizziness   3. Palpitations   4. Allergic rhinitis due to other allergic trigger, unspecified seasonality     Will cover for allergic rhinitis as a source of her dizziness and vertigo.  EKG is very reassuring.  Recommend the patient establish care with: Internal medicine for recheck on anemia, thyroid and other labs. Counseled patient on potential for adverse effects with medications prescribed/recommended today, ER and return-to-clinic precautions discussed, patient verbalized understanding.    Jaynee Eagles, [place] 08/02/19 1922

## 2019-08-02 NOTE — ED Triage Notes (Signed)
Pt sts some dizziness today that is now resolved; pt sts feeling weak during episode

## 2020-01-16 ENCOUNTER — Ambulatory Visit: Payer: Self-pay | Attending: Internal Medicine

## 2020-01-16 ENCOUNTER — Ambulatory Visit: Payer: Medicaid Other | Attending: Internal Medicine

## 2020-01-16 DIAGNOSIS — Z20822 Contact with and (suspected) exposure to covid-19: Secondary | ICD-10-CM

## 2020-01-17 LAB — NOVEL CORONAVIRUS, NAA: SARS-CoV-2, NAA: NOT DETECTED

## 2020-01-22 ENCOUNTER — Encounter (HOSPITAL_COMMUNITY): Payer: Self-pay

## 2020-01-22 ENCOUNTER — Emergency Department (HOSPITAL_COMMUNITY)
Admission: EM | Admit: 2020-01-22 | Discharge: 2020-01-23 | Disposition: A | Discharge status: Home / Self Care | Payer: Self-pay | Attending: Emergency Medicine | Admitting: Emergency Medicine

## 2020-01-22 ENCOUNTER — Other Ambulatory Visit: Payer: Self-pay

## 2020-01-22 DIAGNOSIS — Z79899 Other long term (current) drug therapy: Secondary | ICD-10-CM | POA: Insufficient documentation

## 2020-01-22 DIAGNOSIS — B9689 Other specified bacterial agents as the cause of diseases classified elsewhere: Secondary | ICD-10-CM | POA: Insufficient documentation

## 2020-01-22 DIAGNOSIS — R1032 Left lower quadrant pain: Secondary | ICD-10-CM | POA: Insufficient documentation

## 2020-01-22 DIAGNOSIS — N760 Acute vaginitis: Secondary | ICD-10-CM | POA: Insufficient documentation

## 2020-01-22 LAB — COMPREHENSIVE METABOLIC PANEL
ALT: 13 U/L (ref 0–44)
AST: 16 U/L (ref 15–41)
Albumin: 3.6 g/dL (ref 3.5–5.0)
Alkaline Phosphatase: 48 U/L (ref 38–126)
Anion gap: 10 (ref 5–15)
BUN: 6 mg/dL (ref 6–20)
CO2: 22 mmol/L (ref 22–32)
Calcium: 9.2 mg/dL (ref 8.9–10.3)
Chloride: 105 mmol/L (ref 98–111)
Creatinine, Ser: 0.56 mg/dL (ref 0.44–1.00)
GFR calc Af Amer: >60 mL/min (ref >60)
GFR calc non Af Amer: >60 mL/min (ref >60)
Glucose, Bld: 132 mg/dL — ABNORMAL HIGH (ref 70–99)
Potassium: 3.8 mmol/L (ref 3.5–5.1)
Sodium: 137 mmol/L (ref 135–145)
Total Bilirubin: 0.5 mg/dL (ref 0.3–1.2)
Total Protein: 6.7 g/dL (ref 6.5–8.1)

## 2020-01-22 LAB — CBC
HCT: 37 % (ref 36.0–46.0)
Hemoglobin: 12 g/dL (ref 12.0–15.0)
MCH: 28.2 pg (ref 26.0–34.0)
MCHC: 32.4 g/dL (ref 30.0–36.0)
MCV: 87.1 fL (ref 80.0–100.0)
Platelets: 314 10*3/uL (ref 150–400)
RBC: 4.25 MIL/uL (ref 3.87–5.11)
RDW: 12.8 % (ref 11.5–15.5)
WBC: 9.9 10*3/uL (ref 4.0–10.5)
nRBC: 0 % (ref 0.0–0.2)

## 2020-01-22 LAB — I-STAT BETA HCG BLOOD, ED (MC, WL, AP ONLY): I-stat hCG, quantitative: <5 m[IU]/mL (ref <5)

## 2020-01-22 LAB — LIPASE, BLOOD: Lipase: 22 U/L (ref 11–51)

## 2020-01-22 NOTE — ED Notes (Signed)
[phone] husband osama would like a call for updates

## 2020-01-22 NOTE — ED Triage Notes (Signed)
Pt reports LLQ pain and nausea since this morning, denies any other symptoms. Took tylenol at home without relief. LMP 10 days ago.

## 2020-01-22 NOTE — ED Notes (Signed)
Unk Pinto (husband) [phone] call for translation.

## 2020-01-23 ENCOUNTER — Emergency Department (HOSPITAL_COMMUNITY): Payer: Self-pay

## 2020-01-23 LAB — URINALYSIS, ROUTINE W REFLEX MICROSCOPIC
Bilirubin Urine: NEGATIVE
Glucose, UA: NEGATIVE mg/dL
Hgb urine dipstick: NEGATIVE
Ketones, ur: NEGATIVE mg/dL
Leukocytes,Ua: NEGATIVE
Nitrite: NEGATIVE
Protein, ur: NEGATIVE mg/dL
Specific Gravity, Urine: 1.015 (ref 1.005–1.030)
pH: 5 (ref 5.0–8.0)

## 2020-01-23 LAB — WET PREP, GENITAL
Sperm: NONE SEEN
Trich, Wet Prep: NONE SEEN
Yeast Wet Prep HPF POC: NONE SEEN

## 2020-01-23 MED ORDER — KETOROLAC TROMETHAMINE 30 MG/ML IJ SOLN
30.0000 mg | Freq: Once | INTRAMUSCULAR | Status: AC
  Administered 2020-01-23: 01:00:00 30 mg via INTRAVENOUS
  Filled 2020-01-23: qty 1

## 2020-01-23 MED ORDER — METRONIDAZOLE 500 MG PO TABS
500.0000 mg | ORAL_TABLET | Freq: Once | ORAL | Status: AC
  Administered 2020-01-23: 06:00:00 500 mg via ORAL
  Filled 2020-01-23: qty 1

## 2020-01-23 MED ORDER — METRONIDAZOLE 500 MG PO TABS: 500.0000 mg | ORAL_TABLET | Freq: Two times a day (BID) | ORAL | 0 refills | Status: DC

## 2020-01-23 MED ORDER — ONDANSETRON 4 MG PO TBDP: 4.0000 mg | ORAL_TABLET | Freq: Four times a day (QID) | ORAL | 0 refills | Status: DC | PRN

## 2020-01-23 MED ORDER — ONDANSETRON HCL 4 MG/2ML IJ SOLN
4.0000 mg | Freq: Once | INTRAMUSCULAR | Status: AC
  Administered 2020-01-23: 01:00:00 4 mg via INTRAVENOUS
  Filled 2020-01-23: qty 2

## 2020-01-23 MED ORDER — MORPHINE SULFATE (PF) 4 MG/ML IV SOLN
4.0000 mg | Freq: Once | INTRAVENOUS | Status: AC
  Administered 2020-01-23: 4 mg via INTRAVENOUS
  Filled 2020-01-23: qty 1

## 2020-01-23 MED ORDER — ONDANSETRON HCL 4 MG/2ML IJ SOLN
4.0000 mg | Freq: Once | INTRAMUSCULAR | Status: AC
  Administered 2020-01-23: 02:00:00 4 mg via INTRAVENOUS
  Filled 2020-01-23: qty 2

## 2020-01-23 MED ORDER — IBUPROFEN 800 MG PO TABS: 800.0000 mg | ORAL_TABLET | Freq: Three times a day (TID) | ORAL | 0 refills | Status: DC | PRN

## 2020-01-23 NOTE — Discharge Instructions (Signed)
You may alternate Tylenol 1000 mg every 6 hours as needed for pain, fever and Ibuprofen 800 mg every 8 hours as needed for pain, fever.  Please take Ibuprofen with food.  Do not take more than 4000 mg of Tylenol (acetaminophen) in a 24 hour period.    Steps to find a Primary Care Provider (PCP):  Call [phone] or [phone] to access "[facility] Find a Doctor Service."  2.  You may also go on the [facility] website at [email or web address]  3.  [facility] also frequently accepts new patients.  [facility]  [address] [place] [address] [phone]  4.  There are also multiple [facility], [facility] and [facility] practices throughout the [place] that are frequently accepting new patients. You may find a clinic that is close to your home and contact them.  [email or web address] [phone]  [email or web address]  [email or web address] [phone]  [email or web address] [phone]  5.  Local Health Departments also can provide primary care services.  [facility]  [address] [place] [address] [phone]  [facility] [address] [place] [address] [phone]  [facility] [address]  [place] [address] [phone]   

## 2020-01-23 NOTE — ED Provider Notes (Signed)
TIME SEEN: 12:36 AM  CHIEF COMPLAINT: left pelvic pain  HPI: Patient is a 39 y.o. F with no PMH who presents to ED with LLQ pain that started at 7am.  Has had before and has been intermittent but has not seen a doctor.  Has had burning with urination.  No hematuria.  Has abnormal vaginal discharge - white.  No vaginal bleeding.  LMP was 12/29/19.  Has IUD for 4 years.  Has had irregular periods since.  No fever, V/D.  Has had nausea.  Sexually active with husband.  Patient is G5 P5.  No h/o STDs.  Arabic interpreter used.  ROS: See HPI Constitutional: no fever  Eyes: no drainage  ENT: no runny nose   Cardiovascular:  no chest pain  Resp: no SOB  GI: no vomiting GU: + dysuria Integumentary: no rash  Allergy: no hives  Musculoskeletal: no leg swelling  Neurological: no slurred speech ROS otherwise negative  PAST MEDICAL HISTORY/PAST SURGICAL HISTORY:  History reviewed. No pertinent past medical history.  MEDICATIONS:  Prior to Admission medications   Medication Sig Start Date End Date Taking? Authorizing Provider  cetirizine (ZYRTEC ALLERGY) 10 MG tablet Take 1 tablet (10 mg total) by mouth daily. 08/02/19   Wallis Bamberg, PA-C  meclizine (ANTIVERT) 12.5 MG tablet Take 1 tablet (12.5 mg total) by mouth 2 (two) times daily as needed for dizziness. 08/02/19   Wallis Bamberg, PA-C  promethazine (PHENERGAN) 25 MG tablet Take 0.5-1 tablets (12.5-25 mg total) by mouth every 6 (six) hours as needed for refractory nausea / vomiting (headache). 11/07/15 08/02/19  Gavin Pound, MD    ALLERGIES:  No Known Allergies  SOCIAL HISTORY:  Social History   Tobacco Use  . Smoking status: Never Smoker  Substance Use Topics  . Alcohol use: No    FAMILY HISTORY: Family History  Problem Relation Age of Onset  . Diabetes Sister   . Hypertension Sister     EXAM: BP 106/70 (BP Location: Left Arm)   Pulse 71   Temp 98.7 F (37.1 C) (Oral)   Resp 14   LMP 01/16/2020   SpO2 100%   CONSTITUTIONAL: Alert and oriented and responds appropriately to questions. Well-appearing; well-nourished HEAD: Normocephalic EYES: Conjunctivae clear, pupils appear equal, EOM appear intact ENT: normal nose; moist mucous membranes NECK: Supple, normal ROM CARD: RRR; S1 and S2 appreciated; no murmurs, no clicks, no rubs, no gallops RESP: Normal chest excursion without splinting or tachypnea; breath sounds clear and equal bilaterally; no wheezes, no rhonchi, no rales, no hypoxia or respiratory distress, speaking full sentences ABD/GI: Normal bowel sounds; non-distended; soft, left pelvic tenderness on exam without guarding or rebound hepatosplenomegaly GU:  Normal external genitalia. No lesions, rashes noted. Patient has no vaginal bleeding on exam.  Clear vaginal discharge.  No right adnexal tenderness, mass or fullness, no cervical motion tenderness.  Patient has left adnexal tenderness without mass.  Cervix is not appear friable.  Cervix is closed.  Chaperone present for exam. BACK:  The back appears normal EXT: Normal ROM in all joints; no deformity noted, no edema; no cyanosis SKIN: Normal color for age and race; warm; no rash on exposed skin NEURO: Moves all extremities equally PSYCH: The patient's mood and manner are appropriate.   MEDICAL DECISION MAKING: Patient here with left pelvic pain.  Differential includes torsion, ovarian cyst cyst, UTI, kidney stone, diverticulitis, colitis.  Will give Toradol, Zofran.  Labs here are unremarkable.  Will perform pelvic exam with cultures and obtain  urinalysis.  ED PROGRESS: Patient vomited after Toradol and Zofran.  Reports increasing pain.  Will give morphine and second dose of Zofran.  Has left adnexal tenderness on exam.  No cervical motion tenderness or bleeding or discharge.  Pelvic swabs pending but low suspicion for STD, PID.  In a monogamous relationship with her husband.  Will obtain pelvic ultrasound with Doppler.  Patient's pelvic  ultrasound reveals right ovarian cyst but left ovary appears normal with normal blood flow.  Will obtain CT of abdomen pelvis to evaluate for stone.  Urine shows no sign of infection today.  Patient CT scan shows no hydronephrosis, nephrolithiasis, diverticulosis or diverticulitis, colitis, appendicitis.  No acute abnormality noted other than right ovarian cyst.  Discussed these findings with patient.  Will treat with Flagyl for bacterial vaginosis given she does have clue cells on her wet prep.  Discussed with her that this is not an STD and her husband will not need to be treated.  Will discharge with prescriptions of ibuprofen, Zofran.  Again I do not think she has PID given she has no significant discharge on exam and no cervical motion tenderness.  She does not appear to have cervicitis.  Ultrasound of the left ovary shows normal flow without cysts, other masses, TOA.  I feel she is safe for discharge with further outpatient work-up if symptoms or not improving.   At this time, I do not feel there is any life-threatening condition present. I have reviewed, interpreted and discussed all results (EKG, imaging, lab, urine as appropriate) and exam findings with patient/family. I have reviewed nursing notes and appropriate previous records.  I feel the patient is safe to be discharged home without further emergent workup and can continue workup as an outpatient as needed. Discussed usual and customary return precautions. Patient/family verbalize understanding and are comfortable with this plan.  Outpatient follow-up has been provided as needed. All questions have been answered.     Rebecca Carlson was evaluated in Emergency Department on 01/23/2020 for the symptoms described in the history of present illness. She was evaluated in the context of the global COVID-19 pandemic, which necessitated consideration that the patient might be at risk for infection with the SARS-CoV-2 virus that causes COVID-19.  Institutional protocols and algorithms that pertain to the evaluation of patients at risk for COVID-19 are in a state of rapid change based on information released by regulatory bodies including the CDC and federal and state organizations. These policies and algorithms were followed during the patient's care in the ED.  Patient was seen wearing N95, face shield, gloves.    Caysie Minnifield, Delice Bison, DO 01/23/20 [phone]

## 2020-01-24 LAB — GC/CHLAMYDIA PROBE AMP (~~LOC~~) NOT AT ARMC
Chlamydia: NEGATIVE
Neisseria Gonorrhea: NEGATIVE

## 2020-02-20 ENCOUNTER — Encounter: Payer: Self-pay | Admitting: Internal Medicine

## 2020-02-20 ENCOUNTER — Other Ambulatory Visit: Payer: Self-pay

## 2020-02-20 ENCOUNTER — Ambulatory Visit: Payer: Self-pay | Attending: Internal Medicine | Admitting: Internal Medicine

## 2020-02-20 VITALS — Ht 62.0 in

## 2020-02-20 DIAGNOSIS — N83201 Unspecified ovarian cyst, right side: Secondary | ICD-10-CM | POA: Insufficient documentation

## 2020-02-20 DIAGNOSIS — R1032 Left lower quadrant pain: Secondary | ICD-10-CM

## 2020-02-20 DIAGNOSIS — Z7689 Persons encountering health services in other specified circumstances: Secondary | ICD-10-CM

## 2020-02-20 NOTE — Progress Notes (Signed)
Pt states she gets pain every 3-4 days.   Pt states her pain is worse on the left side.

## 2020-02-20 NOTE — Progress Notes (Signed)
Virtual Visit via Telephone Note Due to current restrictions/limitations of in-office visits due to the COVID-19 pandemic, this scheduled clinical appointment was converted to a telehealth visit  I connected with Rebecca Carlson on 02/20/20 at 10:47 a.m by telephone and verified that I am speaking with the correct person using two identifiers. I am in my office.  The patient is at home.  Only the patient, myself and Neama from [employer] (([id number])participated in this encounter.  I discussed the limitations, risks, security and privacy concerns of performing an evaluation and management service by telephone and the availability of in person appointments. I also discussed with the patient that there may be a patient responsible charge related to this service. The patient expressed understanding and agreed to proceed.   History of Present Illness: Purpose of today's visit is follow-up from the emergency room and to establish care.  Previous PCP was [facility].  Last seen 3 yrs ago to have IUD place.    Patient seen in the emergency room 01/23/2020 with left lower quadrant abdominal pain.  Labs were unrevealing except for clue cells on wet prep.  GC and Chlamydia cultures negative.  Chemistry normal except for elevated glucose of 132.  CBC was normal.  Urinalysis normal.  She had some adnexal tenderness on physical exam.  Pelvic ultrasound revealed ovarian cysts on the right side the largest of which was 4 cm.  CT of the abdomen was negative for any acute findings.  Patient placed on Flagyl for BV. Pt reports improvement since ER.  Felt a little pain on LT side of abdomen 2 days ago for which she took Ibuprofen.  Moving bowels ok. No dysuria    Social History   Socioeconomic History  . Marital status: Married    Spouse name: Not on file  . Number of children: Not on file  . Years of education: Not on file  . Highest education level: Not on file  Occupational History   . Not on file  Tobacco Use  . Smoking status: Never Smoker  . Smokeless tobacco: Never Used  Substance and Sexual Activity  . Alcohol use: No  . Drug use: No  . Sexual activity: Not Currently  Other Topics Concern  . Not on file  Social History Narrative   ** Merged History Encounter **       Social Determinants of Health   Financial Resource Strain:   . Difficulty of Paying Living Expenses: Not on file  Food Insecurity:   . Worried About Charity fundraiser in the Last Year: Not on file  . Ran Out of Food in the Last Year: Not on file  Transportation Needs:   . Lack of Transportation (Medical): Not on file  . Lack of Transportation (Non-Medical): Not on file  Physical Activity:   . Days of Exercise per Week: Not on file  . Minutes of Exercise per Session: Not on file  Stress:   . Feeling of Stress : Not on file  Social Connections:   . Frequency of Communication with Friends and Family: Not on file  . Frequency of Social Gatherings with Friends and Family: Not on file  . Attends Religious Services: Not on file  . Active Member of Clubs or Organizations: Not on file  . Attends Archivist Meetings: Not on file  . Marital Status: Not on file    Observations/Objective: Results for orders placed or performed during the hospital encounter of 01/22/20  Wet  prep, genital   Specimen: Genital  Result Value Ref Range   Yeast Wet Prep HPF POC NONE SEEN NONE SEEN   Trich, Wet Prep NONE SEEN NONE SEEN   Clue Cells Wet Prep HPF POC PRESENT (A) NONE SEEN   WBC, Wet Prep HPF POC MANY (A) NONE SEEN   Sperm NONE SEEN   Lipase, blood  Result Value Ref Range   Lipase 22 11 - 51 U/L  Comprehensive metabolic panel  Result Value Ref Range   Sodium 137 135 - 145 mmol/L   Potassium 3.8 3.5 - 5.1 mmol/L   Chloride 105 98 - 111 mmol/L   CO2 22 22 - 32 mmol/L   Glucose, Bld 132 (H) 70 - 99 mg/dL   BUN 6 6 - 20 mg/dL   Creatinine, Ser 2.20 0.44 - 1.00 mg/dL   Calcium 9.2  8.9 - 25.4 mg/dL   Total Protein 6.7 6.5 - 8.1 g/dL   Albumin 3.6 3.5 - 5.0 g/dL   AST 16 15 - 41 U/L   ALT 13 0 - 44 U/L   Alkaline Phosphatase 48 38 - 126 U/L   Total Bilirubin 0.5 0.3 - 1.2 mg/dL   GFR calc non Af Amer >60 >60 mL/min   GFR calc Af Amer >60 >60 mL/min   Anion gap 10 5 - 15  CBC  Result Value Ref Range   WBC 9.9 4.0 - 10.5 K/uL   RBC 4.25 3.87 - 5.11 MIL/uL   Hemoglobin 12.0 12.0 - 15.0 g/dL   HCT 27.0 62.3 - 76.2 %   MCV 87.1 80.0 - 100.0 fL   MCH 28.2 26.0 - 34.0 pg   MCHC 32.4 30.0 - 36.0 g/dL   RDW 83.1 51.7 - 61.6 %   Platelets 314 150 - 400 K/uL   nRBC 0.0 0.0 - 0.2 %  Urinalysis, Routine w reflex microscopic  Result Value Ref Range   Color, Urine YELLOW YELLOW   APPearance HAZY (A) CLEAR   Specific Gravity, Urine 1.015 1.005 - 1.030   pH 5.0 5.0 - 8.0   Glucose, UA NEGATIVE NEGATIVE mg/dL   Hgb urine dipstick NEGATIVE NEGATIVE   Bilirubin Urine NEGATIVE NEGATIVE   Ketones, ur NEGATIVE NEGATIVE mg/dL   Protein, ur NEGATIVE NEGATIVE mg/dL   Nitrite NEGATIVE NEGATIVE   Leukocytes,Ua NEGATIVE NEGATIVE  I-Stat beta hCG blood, ED  Result Value Ref Range   I-stat hCG, quantitative <5.0 <5 mIU/mL   Comment 3          GC/Chlamydia probe amp ([place])not at [facility]  Result Value Ref Range   Chlamydia Negative    Neisseria Gonorrhea Negative      Assessment and Plan: 1. Encounter to establish care 2. Left lower quadrant abdominal pain Resolved.  3. Cyst of right ovary We will plan to repeat pelvic ultrasound in 6 to 8 weeks.  Informed patient how to go about applying for the orange card/cone discount card.  I will have her follow-up with me in 6 to 8 weeks for Pap smear.  Hopefully at that time she has been approved for the orange card.   Follow Up Instructions: 6-8 wks for pap   I discussed the assessment and treatment plan with the patient. The patient was provided an opportunity to ask questions and all were answered. The patient agreed with  the plan and demonstrated an understanding of the instructions.   The patient was advised to call back or seek an in-person evaluation if the  symptoms worsen or if the condition fails to improve as anticipated.  I provided 20 minutes of non-face-to-face time during this encounter.   Jonah Blue, MD

## 2020-03-04 ENCOUNTER — Ambulatory Visit: Payer: Self-pay | Attending: Internal Medicine

## 2020-03-04 ENCOUNTER — Other Ambulatory Visit: Payer: Self-pay

## 2020-03-05 ENCOUNTER — Ambulatory Visit: Payer: Medicaid Other

## 2020-04-04 ENCOUNTER — Other Ambulatory Visit (HOSPITAL_COMMUNITY)
Admission: RE | Admit: 2020-04-04 | Discharge: 2020-04-04 | Disposition: A | Discharge status: Home / Self Care | Payer: Medicaid Other | Source: Ambulatory Visit | Attending: Internal Medicine | Admitting: Internal Medicine

## 2020-04-04 ENCOUNTER — Encounter: Payer: Self-pay | Admitting: Internal Medicine

## 2020-04-04 ENCOUNTER — Ambulatory Visit: Payer: Medicaid Other | Attending: Internal Medicine | Admitting: Internal Medicine

## 2020-04-04 ENCOUNTER — Other Ambulatory Visit: Payer: Self-pay

## 2020-04-04 VITALS — BP 122/76 | HR 78 | Temp 97.7°F | Resp 16 | Wt 138.2 lb

## 2020-04-04 DIAGNOSIS — N83201 Unspecified ovarian cyst, right side: Secondary | ICD-10-CM | POA: Insufficient documentation

## 2020-04-04 DIAGNOSIS — Z124 Encounter for screening for malignant neoplasm of cervix: Secondary | ICD-10-CM | POA: Insufficient documentation

## 2020-04-04 DIAGNOSIS — Z3009 Encounter for other general counseling and advice on contraception: Secondary | ICD-10-CM

## 2020-04-04 NOTE — Progress Notes (Signed)
Patient ID: Rebecca Carlson, female    DOB: October 28, 1981  MRN: [id number]  CC: Gynecologic Exam   Subjective: Rebecca Carlson is a 39 y.o. female who presents for well woman exam Her concerns today include:   I spoke with pt about 5 wks ago post ER visit for abdominal pain. Work up negative except for cysts RT ovary.  Plan was to repeat the ultrasound in about 6 to 8 weeks. -had similar pain 1 wk ago LNMP 1 wk ago.  Thinks menses are irregular in that they last 10 days.  Then no bleeding for 2 wks the menses start again. Light bleeding. Been this way since IUD placed 3 yrs ago.   -no abn vaginal dischg but a lot of white dischg.  No itching.  Sexually active only with husband.  No pain during intercourse.  Screen for GC/chlamydia done 01/2020 through ER was negative.  No concern with having STD.  Reports increase dischg during intercourse also ever since having IUD placed.  Would like to have IUD removed and will consider an alternative form of birth control.   No fhx of ovarian, breast or uterine CA   Patient Active Problem List   Diagnosis Date Noted  . Cyst of right ovary 02/20/2020  . Previous cesarean section--subsequent VBAC x 3 12/03/2015  . VBAC (vaginal birth after Cesarean) 12/03/2015  . Normal labor 12/01/2015  . 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 ibuprofen (ADVIL) 800 MG tablet Take 1 tablet (800 mg total) by mouth every 8 (eight) hours as needed for mild pain. (Patient not taking: Reported on 02/20/2020) 30 tablet 0  . [DISCONTINUED] promethazine (PHENERGAN) 25 MG tablet Take 0.5-1 tablets (12.5-25 mg total) by mouth every 6 (six) hours as needed for refractory nausea / vomiting (headache). 10 tablet 0   No current facility-administered medications on file prior to visit.    No Known Allergies  Social History   Socioeconomic History  . Marital status: Married    Spouse  name: Not on file  . Number of children: 5  . Years of education: high school  . Highest education level: Not on file  Occupational History  . Not on file  Tobacco Use  . Smoking status: Never Smoker  . Smokeless tobacco: Never Used  Substance and Sexual Activity  . Alcohol use: No  . Drug use: No  . Sexual activity: Not Currently  Other Topics Concern  . Not on file  Social History Narrative   ** Merged History Encounter **       Social Determinants of Health   Financial Resource Strain:   . Difficulty of Paying Living Expenses:   Food Insecurity:   . Worried About Programme researcher, broadcasting/film/video in the Last Year:   . Barista in the Last Year:   Transportation Needs:   . Freight forwarder (Medical):   Marland Kitchen Lack of Transportation (Non-Medical):   Physical Activity:   . Days of Exercise per Week:   . Minutes of Exercise per Session:   Stress:   . Feeling of Stress :   Social Connections:   . Frequency of Communication with Friends and Family:   . Frequency of Social Gatherings with Friends and Family:   . Attends Religious Services:   . Active Member of Clubs or Organizations:   . Attends Banker Meetings:   Marland Kitchen Marital  Status:   Intimate Partner Violence:   . Fear of Current or Ex-Partner:   . Emotionally Abused:   Marland Kitchen Physically Abused:   . Sexually Abused:     Family History  Problem Relation Age of Onset  . Diabetes Sister   . Hypertension Sister     Past Surgical History:  Procedure Laterality Date  . CESAREAN SECTION    . CESAREAN SECTION      ROS: Review of Systems Negative except as stated above  PHYSICAL EXAM: BP 122/76   Pulse 78   Temp 97.7 F (36.5 C)   Resp 16   Wt 138 lb 3.2 oz (62.7 kg)   SpO2 99%   BMI 25.28 kg/m    Physical Exam  General appearance - alert, well appearing, young to middle-aged female and in no distress Mental status - normal mood, behavior, speech, dress, motor activity, and thought processes Nose -  normal and patent, no erythema, discharge or polyps Mouth - mucous membranes moist, pharynx normal without lesions Chest - clear to auscultation, no wheezes, rales or rhonchi, symmetric air entry Heart - normal rate, regular rhythm, normal S1, S2, no murmurs, rubs, clicks or gallops Breasts -CMA Pollock present for breast and pelvic exam: Breasts appear normal, no suspicious masses, no skin or nipple changes or axillary nodes Pelvic -patient does not have a clitoris.  No external vaginal lesions.  She has clear mucus around the cervix.  I do not see an IUD string hanging outside of the cervical os.  No cervical motion tenderness or adnexal masses.   Extremities - peripheral pulses normal, no pedal edema, no clubbing or cyanosis   CMP Latest Ref Rng & Units 01/22/2020 11/07/2015  Glucose 70 - 99 mg/dL 194(R) 89  BUN 6 - 20 mg/dL 6 <7(E)  Creatinine 0.81 - 1.00 mg/dL 4.48 1.85  Sodium 631 - 145 mmol/L 137 135  Potassium 3.5 - 5.1 mmol/L 3.8 3.9  Chloride 98 - 111 mmol/L 105 108  CO2 22 - 32 mmol/L 22 20(L)  Calcium 8.9 - 10.3 mg/dL 9.2 4.9(F)  Total Protein 6.5 - 8.1 g/dL 6.7 0.2(O)  Total Bilirubin 0.3 - 1.2 mg/dL 0.5 0.5  Alkaline Phos 38 - 126 U/L 48 103  AST 15 - 41 U/L 16 18  ALT 0 - 44 U/L 13 10(L)   Lipid Panel  No results found for: CHOL, TRIG, HDL, CHOLHDL, VLDL, LDLCALC, LDLDIRECT  CBC    Component Value Date/Time   WBC 9.9 01/22/2020 1629   RBC 4.25 01/22/2020 1629   HGB 12.0 01/22/2020 1629   HCT 37.0 01/22/2020 1629   PLT 314 01/22/2020 1629   MCV 87.1 01/22/2020 1629   MCH 28.2 01/22/2020 1629   MCHC 32.4 01/22/2020 1629   RDW 12.8 01/22/2020 1629   LYMPHSABS 2.0 11/07/2015 1125   MONOABS 0.5 11/07/2015 1125   EOSABS 0.1 11/07/2015 1125   BASOSABS 0.0 11/07/2015 1125    ASSESSMENT AND PLAN:  1. Pap smear for cervical cancer screening - Cervicovaginal ancillary only - Cytology - PAP  2. Family planning Advised patient that I do not see an IUD string.  We  had plan to do a pelvic ultrasound to follow-up on the cyst on the right ovary.  We will asked that the radiologist to comment whether an IUD is present in the uterus.  If it is and she still wants it taken out, we will need to refer her to gynecology.  3. Cyst of right ovary -  US Pelvic Complete With Transvaginal; Future   Patient was given the opportunity to ask questions.  Patient verbalized understanding of the plan and was able to repeat key elements of the plan.  [employer] interpreter used during this encounter. #[id number]  Orders Placed This Encounter  Procedures  . US Pelvic Complete With Transvaginal     Requested Prescriptions    No prescriptions requested or ordered in this encounter    Return in about 8 weeks (around 05/30/2020).  Karle Plumber, MD, FACP

## 2020-04-05 LAB — CERVICOVAGINAL ANCILLARY ONLY
Bacterial Vaginitis (gardnerella): NEGATIVE
Candida Glabrata: NEGATIVE
Candida Vaginitis: NEGATIVE
Comment: NEGATIVE
Comment: NEGATIVE
Comment: NEGATIVE

## 2020-04-05 LAB — CYTOLOGY - PAP
Comment: NEGATIVE
Diagnosis: NEGATIVE
High risk HPV: NEGATIVE

## 2020-04-08 ENCOUNTER — Telehealth: Payer: Self-pay

## 2020-04-08 NOTE — Telephone Encounter (Signed)
Contacted pt to go over pap results pt didn't answer and was unable to lvm due to phone kept ringing

## 2020-04-12 ENCOUNTER — Ambulatory Visit (HOSPITAL_COMMUNITY): Payer: Self-pay

## 2020-06-07 ENCOUNTER — Ambulatory Visit: Payer: Medicaid Other | Admitting: Internal Medicine

## 2020-06-10 ENCOUNTER — Encounter: Payer: Self-pay | Admitting: Internal Medicine

## 2020-06-10 ENCOUNTER — Other Ambulatory Visit: Payer: Self-pay

## 2020-06-10 ENCOUNTER — Ambulatory Visit: Payer: Self-pay | Attending: Internal Medicine | Admitting: Family

## 2020-06-10 VITALS — BP 118/75 | HR 80 | Temp 97.7°F | Resp 16 | Wt 138.0 lb

## 2020-06-10 DIAGNOSIS — N83201 Unspecified ovarian cyst, right side: Secondary | ICD-10-CM

## 2020-06-10 DIAGNOSIS — Z3009 Encounter for other general counseling and advice on contraception: Secondary | ICD-10-CM

## 2020-06-10 DIAGNOSIS — Z789 Other specified health status: Secondary | ICD-10-CM

## 2020-06-10 NOTE — Patient Instructions (Addendum)
         .      .      .   almawjat fawq alsawtiat ealaa alhawd likays almabyad al'ayman wawade alluwlb. al'iihalat 'iilaa tibi alnisa' li'iizalat alluwlb. almutabaeat mae altabib al'asasii hasb alhajati.   Pelvic ultrasound for right ovary cyst and IUD placement. Referral to Gynecology for IUD removal. Follow-up with primary physician as needed. Ovarian Cyst An ovarian cyst is a fluid-filled sac on an ovary. The ovaries are organs that make eggs in women. Most ovarian cysts go away on their own and are not cancerous (are benign). Some cysts need treatment. Follow these instructions at home:  Take over-the-counter and prescription medicines only as told by your doctor.  Do not drive or use heavy machinery while taking prescription pain medicine.  Get pelvic exams and Pap tests as often as told by your doctor.  Return to your normal activities as told by your doctor. Ask your doctor what activities are safe for you.  Do not use any products that contain nicotine or tobacco, such as cigarettes and e-cigarettes. If you need help quitting, ask your doctor.  Keep all follow-up visits as told by your doctor. This is important. Contact a doctor if:  Your periods are: ? Late. ? Irregular. ? Painful.   Your periods stop.  You have pelvic pain that does not go away.  You have pressure on your bladder.  You have trouble making your bladder empty when you pee (urinate).  You have pain during sex.  You have any of the following in your belly (abdomen): ? A feeling of fullness. ? Pressure. ? Discomfort. ? Pain that does not go away. ? Swelling.  You feel sick most of the time.  You have trouble pooping (have constipation).  You are not as hungry as usual (you lose your appetite).  You get very bad acne.  You start to have more hair on your body and face.  You are  gaining weight or losing weight without changing your exercise and eating habits.  You think you may be pregnant. Get help right away if:  You have belly pain that is very bad or gets worse.  You cannot eat or drink without throwing up (vomiting).  You suddenly get a fever.  Your period is a lot heavier than usual. This information is not intended to replace advice given to you by your health care provider. Make sure you discuss any questions you have with your health care provider. Document Revised: 11/19/2017 Document Reviewed: 05/10/2016 Elsevier Patient Education  2020 [employer].

## 2020-06-10 NOTE — Progress Notes (Signed)
Patient ID: Rebecca Carlson, female    DOB: [birth year]  MRN: [id number]  CC: OVARY CYST FOLLOW-UP    Subjective: Rebecca Carlson is a 39 y.o. female with history of cyst of right ovary who presents for ovary of right cyst follow-up.  1. IUD REMOVAL:  Last visit 04/04/2020 with Dr. Wynetta Emery. During that encounter patient had PAP with reflex. Patient requested to have IUD removed. Patient was advised that IUD string was not present. Plans were made to do a pelvic ultrasound to follow-up on the cyst of the right ovary. Radiology was asked to comment whether an IUD was present in the uterus. If present patient wanted IUD removed. Patient was advised that she would have to be referred to Gynecology for this.  Today reports since her last visit she did not get the ultrasound recommended by her primary physician. Reports she still has IUD still inside uterus to her knowledge. Reports she wants the IUD taken out and considering another option for birth control. Denies abdominal pain. Reports she has had IUD in place for 4 years.  Patient Active Problem List   Diagnosis Date Noted  . Cyst of right ovary 02/20/2020  . Previous cesarean section--subsequent VBAC x 3 12/03/2015  . VBAC (vaginal birth after Cesarean) 12/03/2015  . Normal labor 12/01/2015  . 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 ibuprofen (ADVIL) 800 MG tablet Take 1 tablet (800 mg total) by mouth every 8 (eight) hours as needed for mild pain. (Patient not taking: Reported on 02/20/2020) 30 tablet 0  . [DISCONTINUED] promethazine (PHENERGAN) 25 MG tablet Take 0.5-1 tablets (12.5-25 mg total) by mouth every 6 (six) hours as needed for refractory nausea / vomiting (headache). 10 tablet 0   No current facility-administered medications on file prior to visit.    No Known Allergies  Social History   Socioeconomic History  . Marital status:  Married    Spouse name: Not on file  . Number of children: 5  . Years of education: high school  . Highest education level: Not on file  Occupational History  . Not on file  Tobacco Use  . Smoking status: Never Smoker  . Smokeless tobacco: Never Used  Vaping Use  . Vaping Use: Never used  Substance and Sexual Activity  . Alcohol use: No  . Drug use: No  . Sexual activity: Not Currently  Other Topics Concern  . Not on file  Social History Narrative   ** Merged History Encounter **       Social Determinants of Health   Financial Resource Strain:   . Difficulty of Paying Living Expenses:   Food Insecurity:   . Worried About Charity fundraiser in the Last Year:   . Arboriculturist in the Last Year:   Transportation Needs:   . Film/video editor (Medical):   Marland Kitchen Lack of Transportation (Non-Medical):   Physical Activity:   . Days of Exercise per Week:   . Minutes of Exercise per Session:   Stress:   . Feeling of Stress :   Social Connections:   . Frequency of Communication with Friends and Family:   . Frequency of Social Gatherings with Friends and Family:   . Attends Religious Services:   . Active Member of Clubs or Organizations:   . Attends Archivist Meetings:   Marland Kitchen Marital Status:   Intimate Production manager  Violence:   . Fear of Current or Ex-Partner:   . Emotionally Abused:   Marland Kitchen Physically Abused:   . Sexually Abused:     Family History  Problem Relation Age of Onset  . Diabetes Sister   . Hypertension Sister     Past Surgical History:  Procedure Laterality Date  . CESAREAN SECTION    . CESAREAN SECTION      ROS: Review of Systems Negative except as stated above  PHYSICAL EXAM: BP 118/75   Pulse 80   Temp 97.7 F (36.5 C)   Resp 16   Wt 138 lb (62.6 kg)   SpO2 99%   BMI 25.24 kg/m   Physical Exam General appearance - alert, well appearing, and in no distress and oriented to person, place, and time Mental status - alert, oriented to  person, place, and time, normal mood, behavior, speech, dress, motor activity, and thought processes Neck - supple, no significant adenopathy Lymphatics - no palpable lymphadenopathy, no hepatosplenomegaly Chest - clear to auscultation, no wheezes, rales or rhonchi, symmetric air entry, no tachypnea, retractions or cyanosis Heart - normal rate, regular rhythm, normal S1, S2, no murmurs, rubs, clicks or gallops Abdomen - soft, nontender, nondistended, no masses or organomegaly Pelvic - exam declined by the patient  ASSESSMENT AND PLAN: 1. Family planning: -Today referral for pelvic ultrasound to see if IUD is present. Will ask Radiology to comment to see if IUD is present in uterus.  -Order for repeat ultrasound entered on 04/04/2020 by her primary physician. Patient reports she never went to get this done. -Initial ultrasound was on 01/22/2020 at the [facility] emergency department. -Patient is being referred to Gynecology in the event that Radiology is able to see if IUD is present in uterus. -Patient would like IUD removed if present in the uterus and would like to try alternative method of birth control. - Ambulatory referral to Gynecology - US Pelvic Complete With Transvaginal; Future  2. Cyst of right ovary: -Today referral for pelvic ultrasound to evaluate cyst of right ovary.  -Order for repeat ultrasound entered on 04/04/2020 by her primary physician. Patient reports she never went to get this done. -Initial ultrasound was on 01/22/2020 at the [facility] emergency department.  - Ambulatory referral to Gynecology - US Pelvic Complete With Transvaginal; Future  3. Language barrier: Equities trader participated during this visit.  -Interpreter Name: Dois Davenport, [place]: [id number]  Patient was given the opportunity to ask questions.  Patient verbalized understanding of the plan and was able to repeat key elements of the plan. Patient was given clear instructions to go to  Emergency Department or return to medical center if symptoms don't improve, worsen, or new problems develop.The patient verbalized understanding.   Rema Fendt, NP

## 2020-06-18 ENCOUNTER — Ambulatory Visit (HOSPITAL_COMMUNITY)
Admission: RE | Admit: 2020-06-18 | Discharge: 2020-06-18 | Disposition: A | Discharge status: Home / Self Care | Payer: Self-pay | Source: Ambulatory Visit | Attending: Family | Admitting: Family

## 2020-06-18 DIAGNOSIS — Z3009 Encounter for other general counseling and advice on contraception: Secondary | ICD-10-CM | POA: Insufficient documentation

## 2020-06-18 DIAGNOSIS — N83201 Unspecified ovarian cyst, right side: Secondary | ICD-10-CM | POA: Insufficient documentation

## 2020-06-20 NOTE — Progress Notes (Signed)
Please call patient with update.   Ultrasound shows IUD is in the correct position.  Shows normal ovaries.  Patient should keep Gynecology referral appointment to have IUD removed and ovaries evaluated further.

## 2020-06-27 ENCOUNTER — Telehealth: Payer: Self-pay

## 2020-06-27 NOTE — Telephone Encounter (Signed)
[employer] interpre[employer] Safia  Id# [phone]  contacted pt to go over [place]  results pt didn't answer lvm asking pt to give a call back at her earliest convenience

## 2020-08-05 ENCOUNTER — Encounter: Payer: Medicaid Other | Admitting: Obstetrics and Gynecology

## 2020-09-05 ENCOUNTER — Encounter: Payer: Self-pay | Admitting: Obstetrics and Gynecology

## 2020-09-05 ENCOUNTER — Ambulatory Visit (INDEPENDENT_AMBULATORY_CARE_PROVIDER_SITE_OTHER): Payer: Medicaid Other | Admitting: Obstetrics and Gynecology

## 2020-09-05 ENCOUNTER — Other Ambulatory Visit: Payer: Self-pay

## 2020-09-05 VITALS — BP 115/74 | HR 78 | Wt 137.0 lb

## 2020-09-05 DIAGNOSIS — Z30432 Encounter for removal of intrauterine contraceptive device: Secondary | ICD-10-CM | POA: Diagnosis not present

## 2020-09-05 MED ORDER — PREPLUS 27-1 MG PO TABS: 1.0000 | ORAL_TABLET | Freq: Every day | ORAL | 13 refills | Status: DC

## 2020-09-05 NOTE — Progress Notes (Signed)
GYNECOLOGY CLINIC PROCEDURE NOTE  Rebecca Carlson is a 39 y.o. [id number] here for IUD removal. No GYN concerns.  Last pap smear was on 04/04/20 and was normal.  IUD Removal  Patient identified, informed consent performed, consent signed.  Patient was in the dorsal lithotomy position, normal external genitalia was noted.  A speculum was placed in the patient's vagina, normal discharge was noted, no lesions. The cervix was visualized, no lesions, no abnormal discharge.  The strings of the IUD were not visualized, so Kelly forceps were introduced into the endometrial cavity and the IUD was grasped and removed in its entirety.  Patient tolerated the procedure well.    Patient plans for pregnancy soon and she was told to avoid teratogens, take PNV and folic acid.  Routine preventative health maintenance measures emphasized.

## 2020-10-10 ENCOUNTER — Encounter: Payer: Self-pay | Admitting: *Deleted

## 2020-11-07 ENCOUNTER — Encounter (HOSPITAL_COMMUNITY): Payer: Self-pay

## 2020-11-07 ENCOUNTER — Ambulatory Visit (HOSPITAL_COMMUNITY)
Admission: EM | Admit: 2020-11-07 | Discharge: 2020-11-07 | Disposition: A | Discharge status: Home / Self Care | Payer: Self-pay | Attending: Family Medicine | Admitting: Family Medicine

## 2020-11-07 ENCOUNTER — Other Ambulatory Visit: Payer: Self-pay

## 2020-11-07 DIAGNOSIS — J209 Acute bronchitis, unspecified: Secondary | ICD-10-CM

## 2020-11-07 MED ORDER — PREDNISONE 20 MG PO TABS: 40.0000 mg | ORAL_TABLET | Freq: Every day | ORAL | 0 refills | Status: AC

## 2020-11-07 MED ORDER — ALBUTEROL SULFATE HFA 108 (90 BASE) MCG/ACT IN AERS
2.0000 | INHALATION_SPRAY | Freq: Once | RESPIRATORY_TRACT | Status: AC
  Administered 2020-11-07: 2 via RESPIRATORY_TRACT

## 2020-11-07 MED ORDER — BENZONATATE 100 MG PO CAPS: 100.0000 mg | ORAL_CAPSULE | Freq: Three times a day (TID) | ORAL | 0 refills | Status: DC | PRN

## 2020-11-07 MED ORDER — ALBUTEROL SULFATE HFA 108 (90 BASE) MCG/ACT IN AERS
INHALATION_SPRAY | RESPIRATORY_TRACT | Status: AC
  Filled 2020-11-07: qty 6.7

## 2020-11-07 NOTE — ED Provider Notes (Signed)
[facility]    CSN: [id number] Arrival date & time: 11/07/20  1053      History   Chief Complaint Chief Complaint  Patient presents with  . URI  . Cough  . Shortness of Breath    HPI Rebecca Carlson is a 39 y.o. female.   HPI  Patient presents today with a concern of worsening nonproductive cough, congestion chest tightness and shortness of breath with throat irritation intermittently occurring over the course of the last month.  Patient reports that symptoms are likely related to her allergies.  She reports when returning from her home country and arriving here in the states she often develops allergy type symptoms.  However she is not vaccinated against COVID-19 and has not had any influenza vaccine.  She denies any sick contacts.  She reports taking multiple medications over-the-counter. History reviewed. No pertinent past medical history.  Patient Active Problem List   Diagnosis Date Noted  . Cyst of right ovary 02/20/2020  . Previous cesarean section--subsequent VBAC x 3 12/03/2015  . VBAC (vaginal birth after Cesarean) 12/03/2015  . Normal labor 12/01/2015  . 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 CESAREAN SECTION    . CESAREAN SECTION      OB History    Gravida  11   Para  9   Term  5   Preterm  0   AB  0   Living  1     SAB  0   TAB  0   Ectopic  0   Multiple      Live Births  1            Home Medications    Prior to Admission medications   Medication Sig Start Date End Date Taking? Authorizing Provider  Prenatal Vit-Fe Fumarate-FA (PREPLUS) 27-1 MG TABS Take 1 tablet by mouth daily. 09/05/20   Constant, Peggy, MD  promethazine (PHENERGAN) 25 MG tablet Take 0.5-1 tablets (12.5-25 mg total) by mouth every 6 (six) hours as needed for refractory nausea / vomiting (headache). 11/07/15 08/02/19  Gavin Pound, MD    Family History Family History  Problem Relation  Age of Onset  . Diabetes Sister   . Hypertension Sister     Social History Social History   Tobacco Use  . Smoking status: Never Smoker  . Smokeless tobacco: Never Used  Vaping Use  . Vaping Use: Never used  Substance Use Topics  . Alcohol use: No  . Drug use: No     Allergies   Patient has no known allergies.   Review of Systems Review of Systems Pertinent negatives listed in HPI  Physical Exam Triage Vital Signs ED Triage Vitals  Enc Vitals Group     BP 11/07/20 1154 119/78     Pulse Rate 11/07/20 1154 82     Resp 11/07/20 1154 18     Temp 11/07/20 1154 98.9 F (37.2 C)     Temp Source 11/07/20 1154 Oral     SpO2 11/07/20 1154 100 %     Weight --      Height --      Head Circumference --      Peak Flow --      Pain Score 11/07/20 1152 5     Pain Loc --      Pain Edu? --      Excl. in GC? --    No data found.  Updated Vital Signs BP 119/78 (BP Location: Right Arm)   Pulse 82   Temp 98.9 F (37.2 C) (Oral)   Resp 18   SpO2 100%   Visual Acuity Right Eye Distance:   Left Eye Distance:   Bilateral Distance:    Right Eye Near:   Left Eye Near:    Bilateral Near:     Physical Exam Constitutional:      Appearance: She is not ill-appearing.  HENT:     Head: Normocephalic.     Nose: Congestion and rhinorrhea present.  Cardiovascular:     Rate and Rhythm: Normal rate and regular rhythm.  Pulmonary:     Effort: Pulmonary effort is normal.     Breath sounds: Wheezing present.  Skin:    General: Skin is warm and dry.     Capillary Refill: Capillary refill takes less than 2 seconds.  Neurological:     General: No focal deficit present.     Mental Status: She is alert.  Psychiatric:        Mood and Affect: Mood normal.        Behavior: Behavior normal.      UC Treatments / Results  Labs (all labs ordered are listed, but only abnormal results are displayed) Labs Reviewed - No data to display  EKG   Radiology No results  found.  Procedures Procedures (including critical care time)  Medications Ordered in UC Medications - No data to display  Initial Impression / Assessment and Plan / UC Course  I have reviewed the triage vital signs and the nursing notes.  Pertinent labs & imaging results that were available during my care of the patient were reviewed by me and considered in my medical decision making (see chart for details).     Treating for acute bronchitis.  Patient declined respiratory panel testing.  Treating conservatively with prednisone and benzonatate for cough.  Patient also prescribed an albuterol inhaler as needed use as needed for shortness of breath and or cyclic coughing.  Patient advised if symptoms worsen or do not improve to go immediately to the emergency department. Final Clinical Impressions(s) / UC Diagnoses   Final diagnoses:  Acute bronchitis, unspecified organism   Discharge Instructions   None    ED Prescriptions    Medication Sig Dispense Auth. Provider   predniSONE (DELTASONE) 20 MG tablet Take 2 tablets (40 mg total) by mouth daily with breakfast for 5 days. 10 tablet Bing Neighbors, FNP   benzonatate (TESSALON) 100 MG capsule Take 1-2 capsules (100-200 mg total) by mouth 3 (three) times daily as needed for cough. 40 capsule Bing Neighbors, FNP     PDMP not reviewed this encounter.   Bing Neighbors, FNP 11/07/20 1410

## 2020-11-07 NOTE — ED Triage Notes (Signed)
Pt presents with non productive cough, congestion, chest discomfort with coughing, shortness of breath, and sore throat X 1 month.

## 2020-12-22 IMAGING — CT CT ABD-PELV W/O CM
2 of 4 series · 16 of 46 positions shown, 18 images · non-contrast
Comparison: None.

CLINICAL DATA: Left flank pain extending into the lower quadrant
with nausea and vomiting

EXAM:
CT ABDOMEN AND PELVIS WITHOUT CONTRAST
TECHNIQUE: Multidetector CT imaging of the abdomen and pelvis was performed
following the standard protocol without IV contrast.

[Series 3: ap without · axial · non-contrast · 0.75mm/px · z∈[+821,+1221]mm · 13 of 90 slices shown, 15 images]
[im 5/90  soft-tissue]
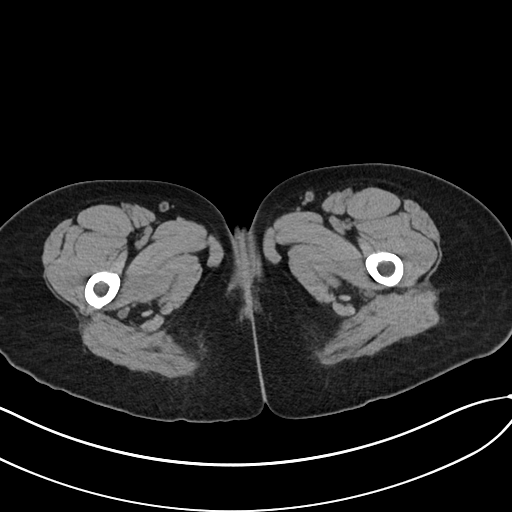
[im 5/90  bone]
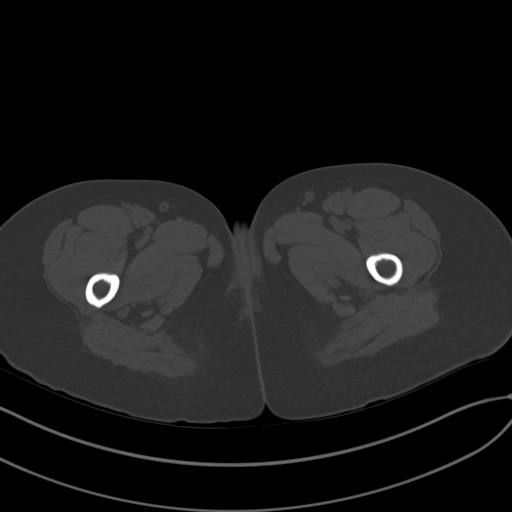
[im 14/90  soft-tissue]
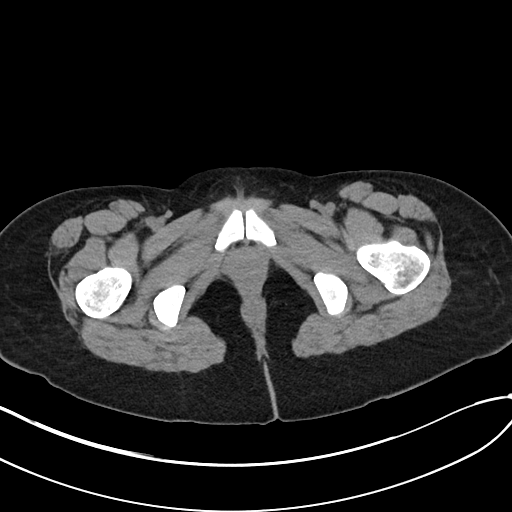
[im 18/90  soft-tissue]
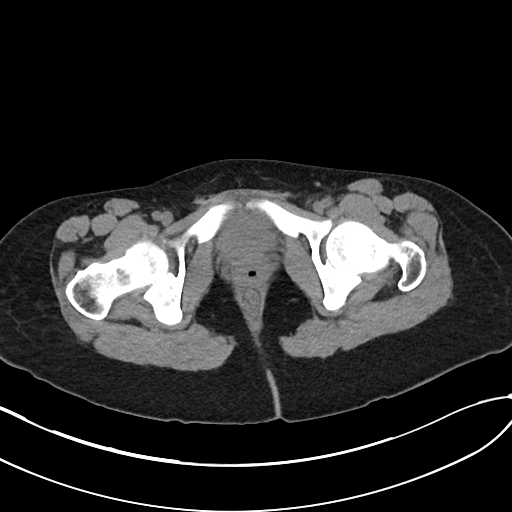
[im 27/90  soft-tissue]
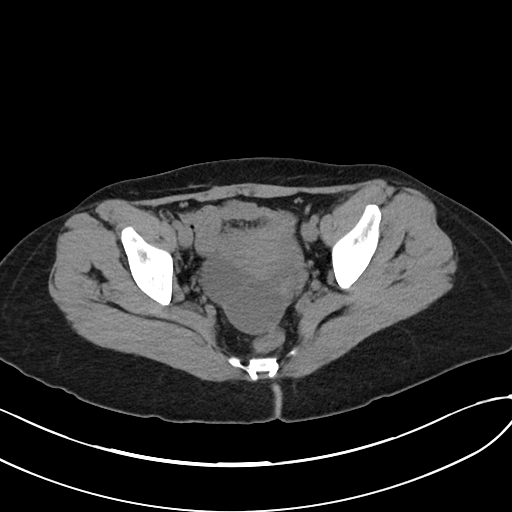
[im 32/90  soft-tissue]
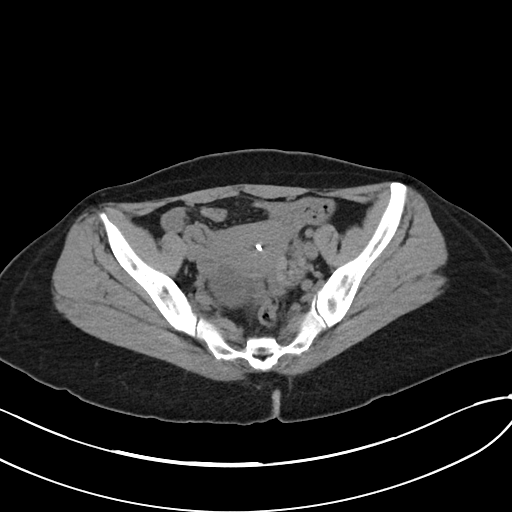
[im 41/90  soft-tissue]
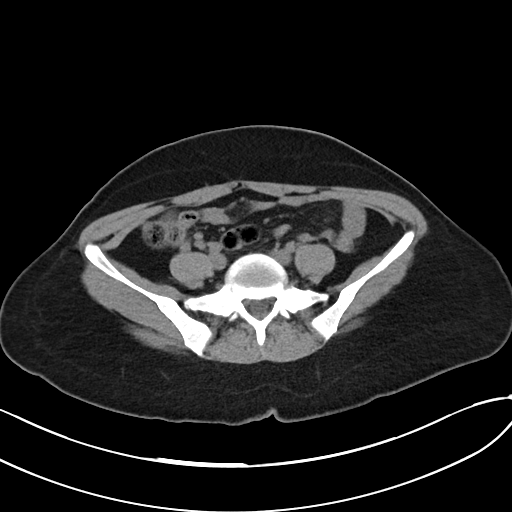
[im 45/90  soft-tissue]
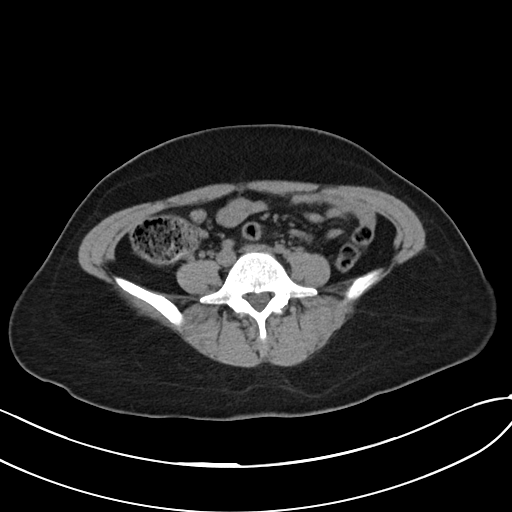
[im 49/90  soft-tissue]
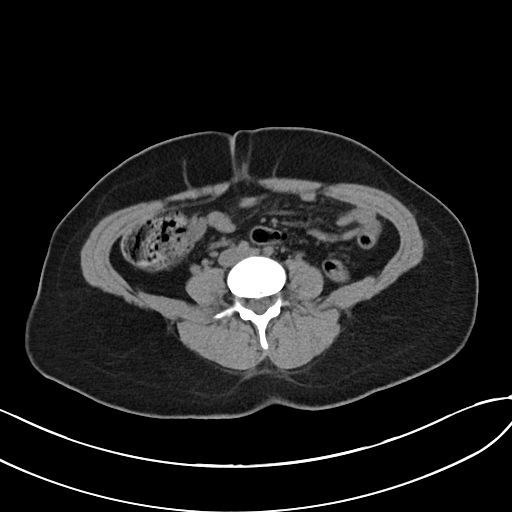
[im 58/90  soft-tissue]
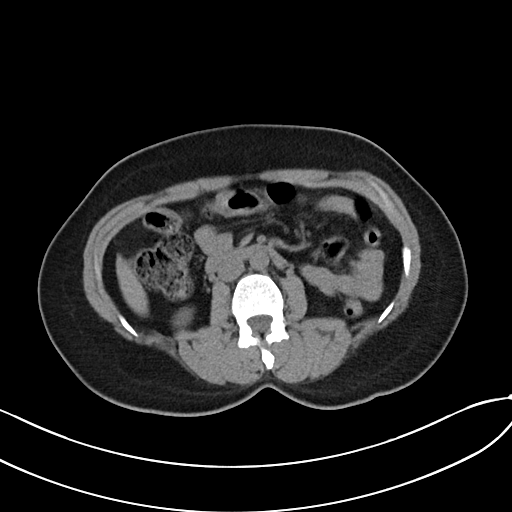
[im 58/90  bone]
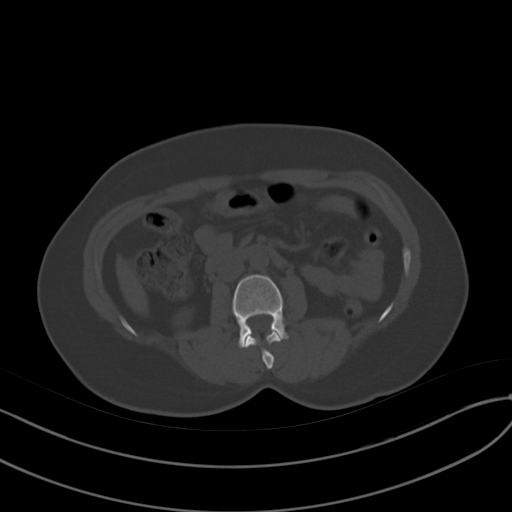
[im 63/90  soft-tissue]
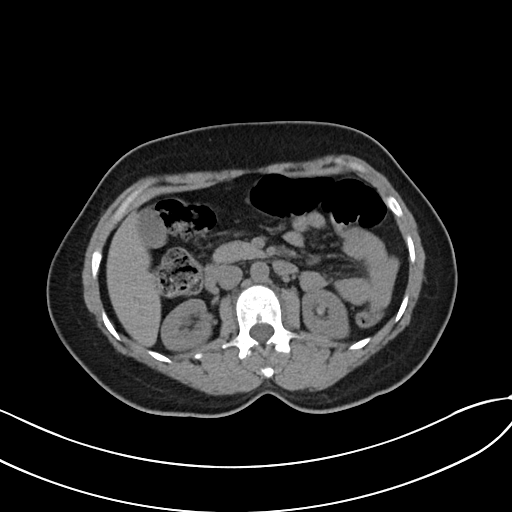
[im 72/90  soft-tissue]
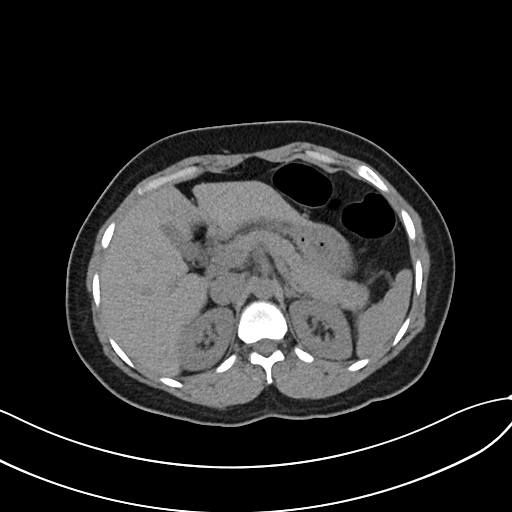
[im 76/90  soft-tissue]
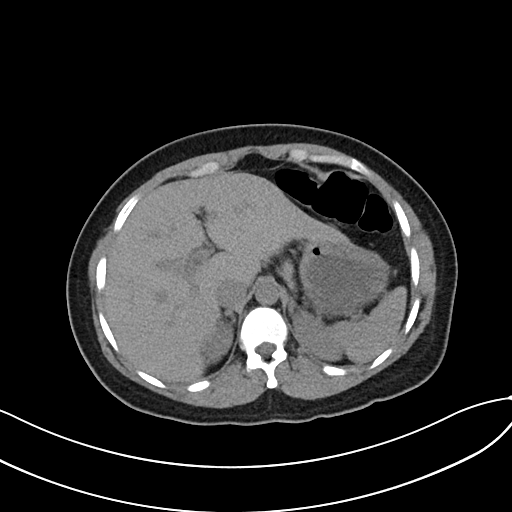
[im 85/90  soft-tissue]
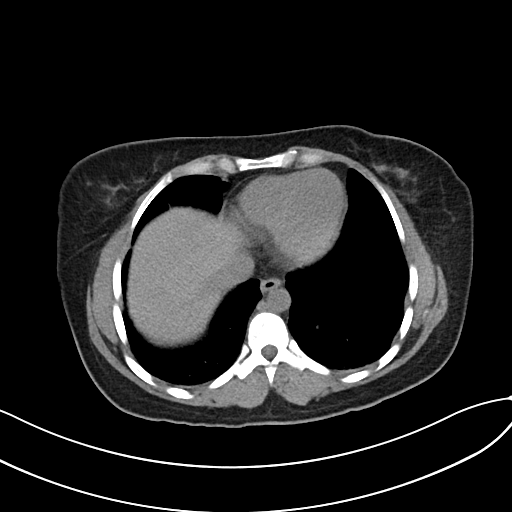

[Series 6: cor · coronal · 0.71mm/px · 3 of 79 slices shown]
[im 27/79  soft-tissue]
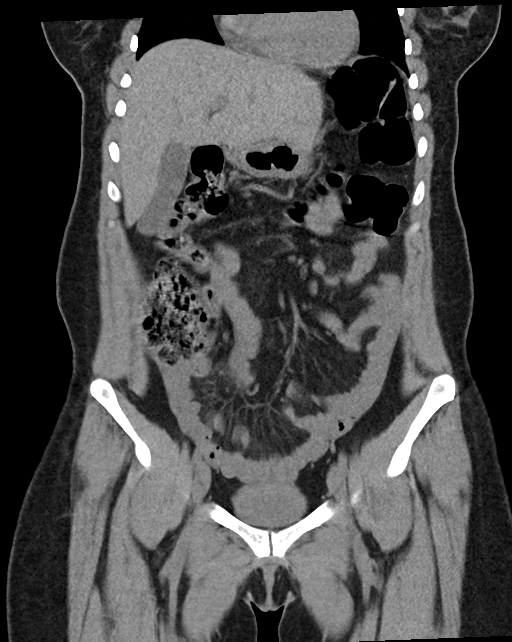
[im 35/79  soft-tissue]
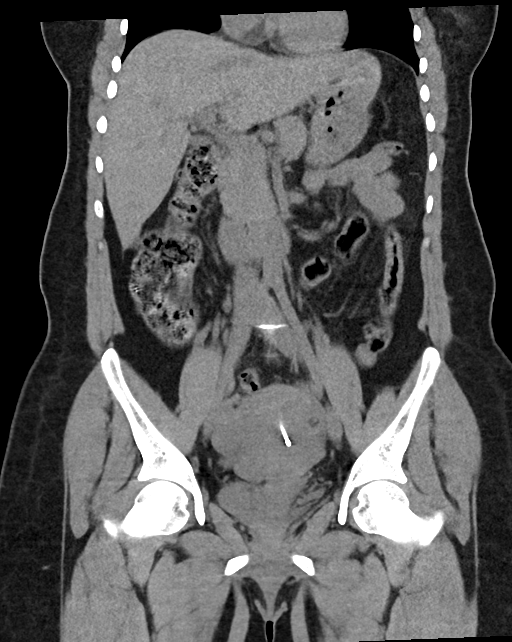
[im 44/79  soft-tissue]
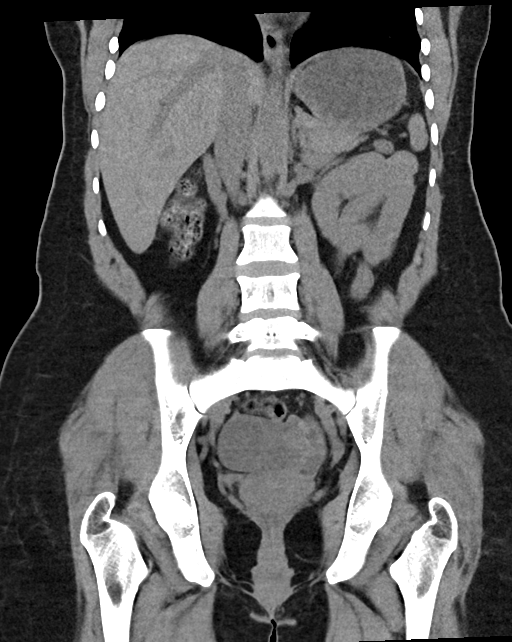

[16 of 46 positions shown; findings below may reference images not displayed]

FINDINGS: Lower chest:  No contributory findings.

Hepatobiliary: No focal liver abnormality.No evidence of biliary
obstruction or stone.

Pancreas: Unremarkable.

Spleen: Unremarkable.

Adrenals/Urinary Tract: Negative adrenals. No hydronephrosis or
stone. Unremarkable bladder.

Stomach/Bowel:  No obstruction. No appendicitis.

Vascular/Lymphatic: No acute vascular abnormality. No mass or
adenopathy.

Reproductive:Cystic structure/s behind the uterus evaluated by
pelvic ultrasound earlier today when it was attributed to the right
ovary. Serpiginous high-density along the left adnexa is presumably
parametrial veins. Doppler assessment was performed of both ovaries.
There is an IUD which is in good position.

Other: No ascites or pneumoperitoneum.

Musculoskeletal: No acute abnormalities.
IMPRESSION: 1. No hydronephrosis or nephrolithiasis.
2. Enlarged/cystic adnexa as evaluated by ultrasound earlier today.

## 2020-12-22 IMAGING — US US PELVIS COMPLETE
1 series · 14 of 25 positions shown · non-contrast
Comparison: None.

CLINICAL DATA: Left-sided pelvic pain

EXAM:
TRANSABDOMINAL ULTRASOUND OF PELVIS
DOPPLER ULTRASOUND OF OVARIES
TECHNIQUE: Transabdominal ultrasound examination of the pelvis was performed
including evaluation of the uterus, ovaries, adnexal regions, and
pelvic cul-de-sac.
Color and duplex Doppler ultrasound was utilized to evaluate blood
flow to the ovaries.

[Series 1: us pelvis complete · 53 acquisitions, 14 frames shown]
[im 1/53]
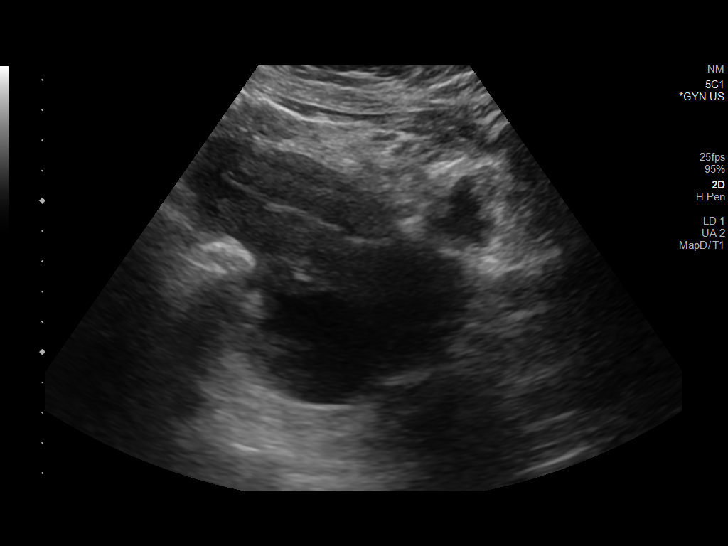
[im 5/53]
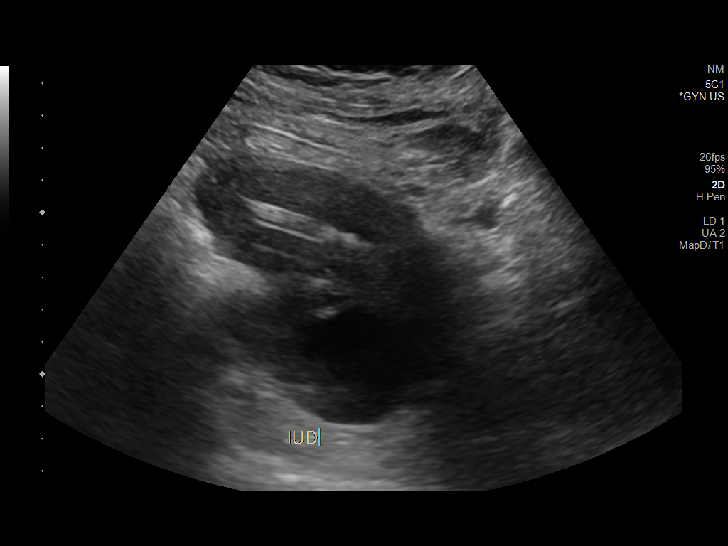
[im 9/53]
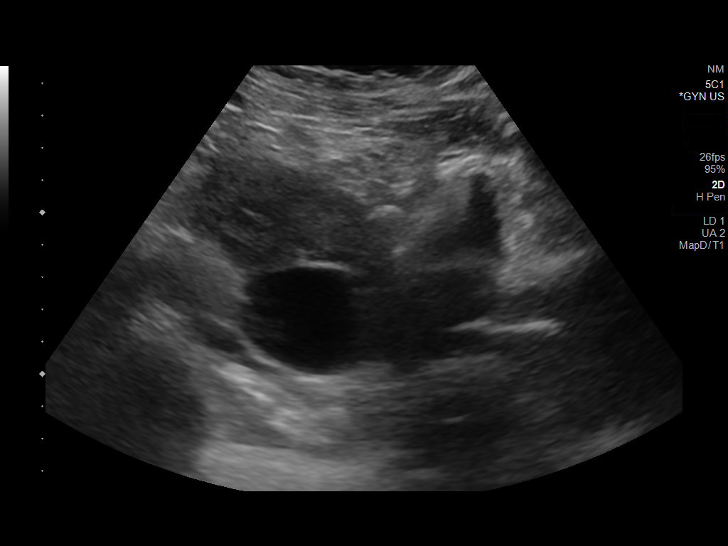
[im 14/53]
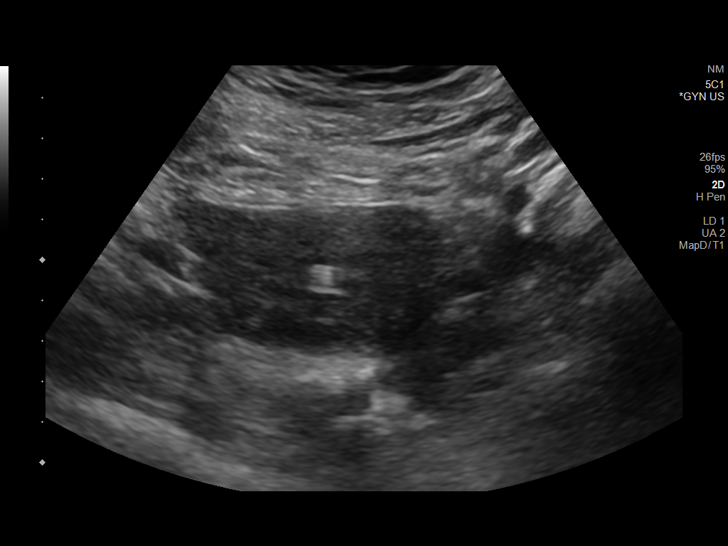
[im 18/53]
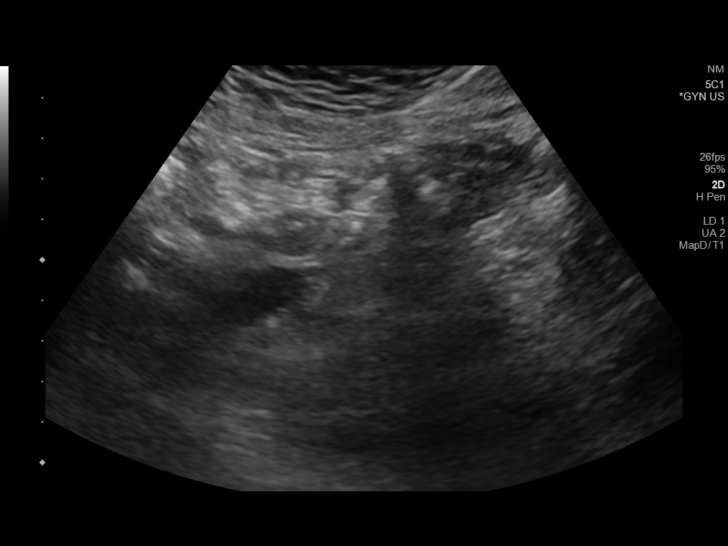
[im 20/53]
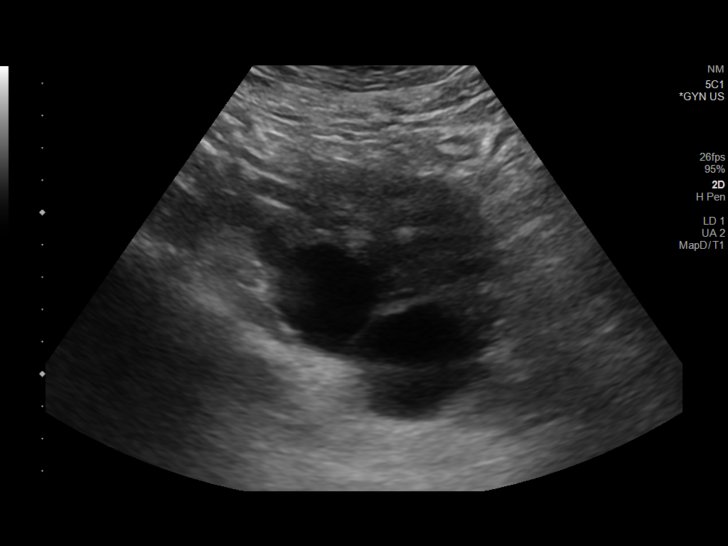
[im 24/53]
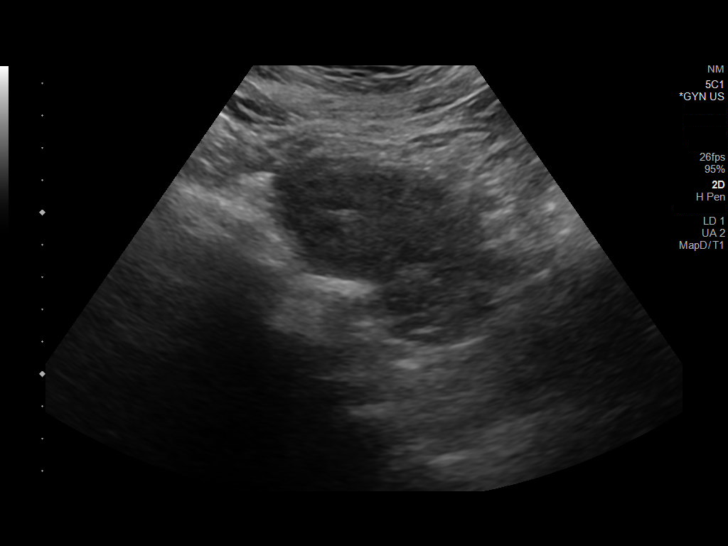
[im 29/53]
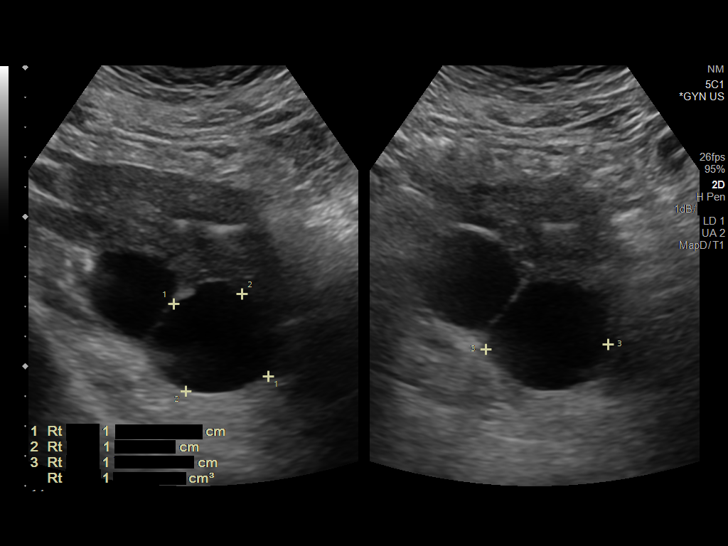
[im 33/53]
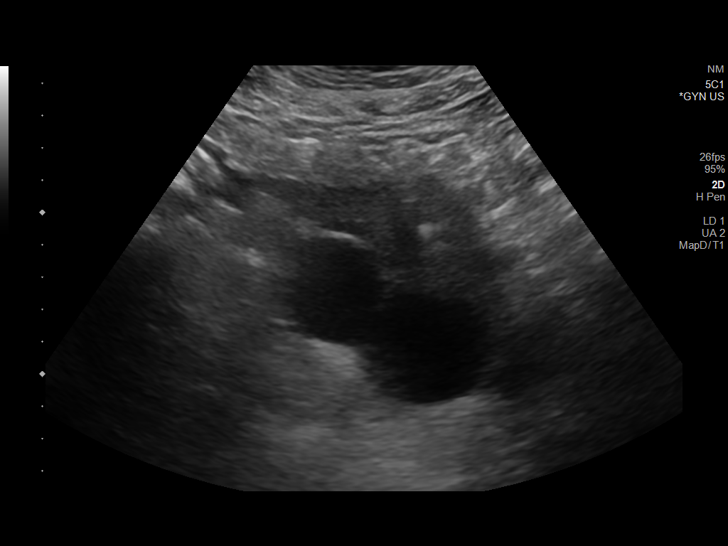
[im 35/53]
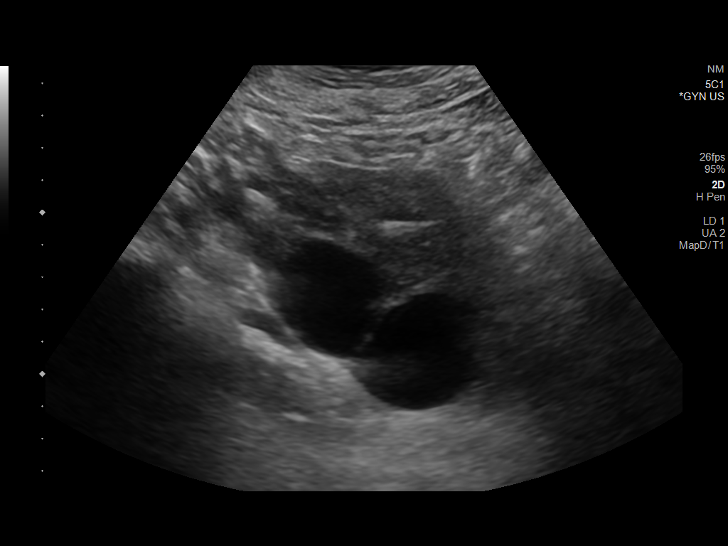
[im 40/53]
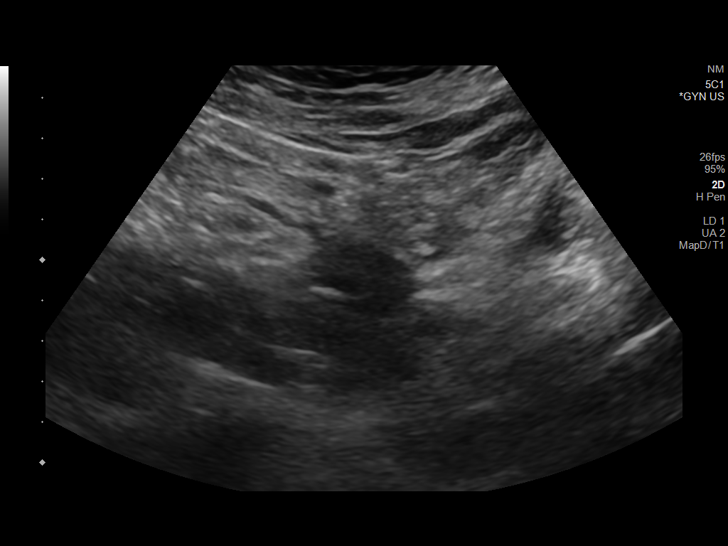
[im 44/53]
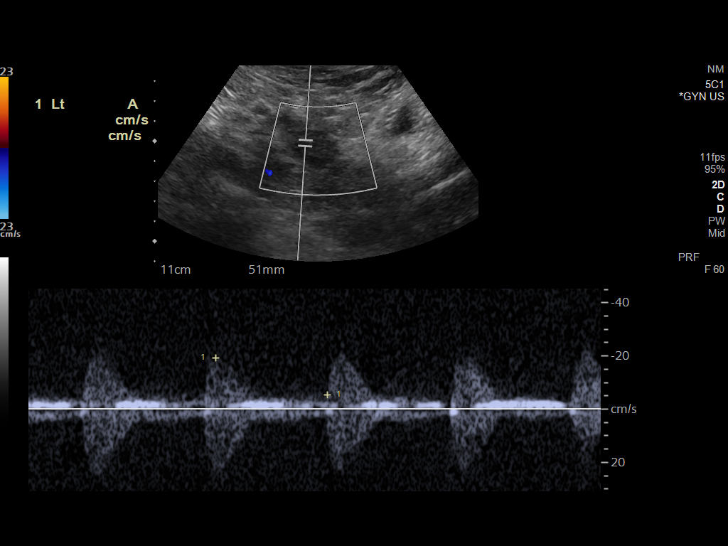
[im 48/53]
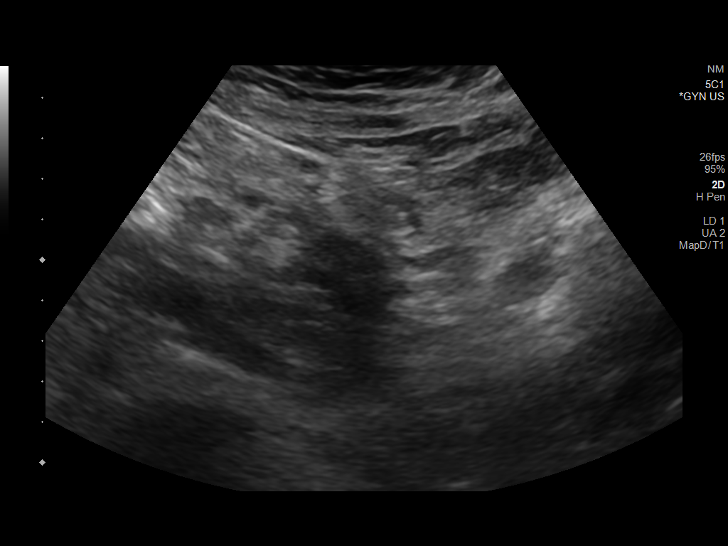
[im 53/53]
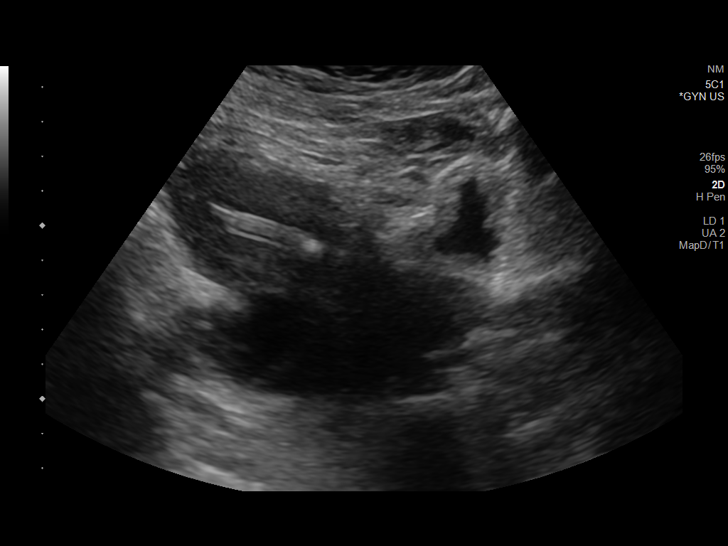

[14 of 25 positions shown; findings below may reference images not displayed]

FINDINGS: Uterus

Measurements: 10.2 x 3.3 x 6.2 cm. = volume: 108 mL. No fibroids or
other mass visualized.

Endometrium

IUD is noted in place.

Right ovary

Measurements: 8.2 x 4.7 x 7.2 cm. = volume: 142 mL. Multiple cysts
are seen. The largest of these measures 4 cm in dimension.

Left ovary

Measurements: 2.6 x 1.9 x 2.4 cm. = volume: 6 mL. Normal
appearance/no adnexal mass.

Pulsed Doppler evaluation demonstrates normal low-resistance
arterial and venous waveforms in both ovaries.

Other: None
IMPRESSION: Multiple right ovarian cysts. The largest of these measures 4 cm in
greatest dimension.

IUD in place.

## 2021-09-08 ENCOUNTER — Ambulatory Visit (INDEPENDENT_AMBULATORY_CARE_PROVIDER_SITE_OTHER): Payer: Medicaid Other

## 2021-09-08 ENCOUNTER — Other Ambulatory Visit: Payer: Self-pay

## 2021-09-08 DIAGNOSIS — Z3201 Encounter for pregnancy test, result positive: Secondary | ICD-10-CM

## 2021-09-08 LAB — POCT PREGNANCY, URINE: Preg Test, Ur: POSITIVE — AB

## 2021-09-08 NOTE — Progress Notes (Signed)
Pt dropped off urine today for UPT. UPT is positive. Called pt with results using [employer]. Pt given results and recommendations. Pt verbalized understanding. Pt is established with [facility].  Pt denies any vaginal bleeding, abd pain or cramps at this time. Pt advised to start taking PNV.  Pt verbalized understanding and agreeable to plan of care.   Pt states has +FM.   LMP: 05/08/21. Had IUD removed on 09/05/2020 EDC: 02/12/22 [redacted]w[redacted]d   Pt scheduled for new OB intake on 9/21 at 10:15am. Pt agreeable to plan of care.   Judeth Cornfield, RN  [phone]- alt number for virtual appt.

## 2021-09-08 NOTE — Progress Notes (Signed)
Chart reviewed for nurse visit. Agree with plan of care.   Judeth Horn, NP 09/08/2021 4:20 PM

## 2021-09-10 ENCOUNTER — Telehealth (INDEPENDENT_AMBULATORY_CARE_PROVIDER_SITE_OTHER): Payer: Self-pay

## 2021-09-10 DIAGNOSIS — O34219 Maternal care for unspecified type scar from previous cesarean delivery: Secondary | ICD-10-CM

## 2021-09-10 DIAGNOSIS — Z3A00 Weeks of gestation of pregnancy not specified: Secondary | ICD-10-CM

## 2021-09-10 DIAGNOSIS — J302 Other seasonal allergic rhinitis: Secondary | ICD-10-CM

## 2021-09-10 DIAGNOSIS — O09522 Supervision of elderly multigravida, second trimester: Secondary | ICD-10-CM

## 2021-09-10 DIAGNOSIS — O0990 Supervision of high risk pregnancy, unspecified, unspecified trimester: Secondary | ICD-10-CM

## 2021-09-10 DIAGNOSIS — O09523 Supervision of elderly multigravida, third trimester: Secondary | ICD-10-CM | POA: Insufficient documentation

## 2021-09-10 DIAGNOSIS — O09529 Supervision of elderly multigravida, unspecified trimester: Secondary | ICD-10-CM

## 2021-09-10 MED ORDER — PRENATAL PLUS 27-1 MG PO TABS: 1.0000 | ORAL_TABLET | Freq: Every day | ORAL | 11 refills | Status: DC

## 2021-09-10 MED ORDER — LORATADINE 10 MG PO TABS: 10.0000 mg | ORAL_TABLET | Freq: Every day | ORAL | Status: DC

## 2021-09-10 MED ORDER — LORATADINE 10 MG PO TABS: 10.0000 mg | ORAL_TABLET | Freq: Every day | ORAL | 3 refills | Status: DC

## 2021-09-10 NOTE — Progress Notes (Signed)
New OB Intake  I connected with  Rebecca Carlson on 09/10/21 at 10:15 AM EDT by telephone Video Visit and verified that I am speaking with the correct person using two identifiers. Nurse is located at [facility] and pt is located at home.  I discussed the limitations, risks, security and privacy concerns of performing an evaluation and management service by telephone and the availability of in person appointments. I also discussed with the patient that there may be a patient responsible charge related to this service. The patient expressed understanding and agreed to proceed.  I explained I am completing New OB Intake today. We discussed her EDD of 02/12/22 that is based on LMP of 05/08/21. Pt is G6/P5. I reviewed her allergies, medications, Medical/Surgical/OB history, and appropriate screenings. I informed her of [facility] services. Based on history, this is a/an  pregnancy complicated by C/S & AMA  .   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   MyChart/Babyscripts MyChart access verified. I explained pt will have some visits in office and some virtually. Babyscripts instructions given and order placed. Patient verifies receipt of registration text/e-mail. Account successfully created and app downloaded.  Blood Pressure Cuff  Blood pressure cuff ordered for patient to pick-up from [employer]. Explained after first prenatal appt pt will check weekly and document in Babyscripts.  Weight scale: Patient    have weight scale. Weight scale ordered for patient to pick up  form [facility].   Anatomy US Explained first scheduled [place] will be around 19 weeks. Anatomy US scheduled for 10/01/21 at 2:45P . Pt notified to arrive at 2:30P.  Labs Discussed Avelina Laine genetic screening with patient. Would like both Panorama and Horizon drawn at new OB visit. Routine prenatal labs needed.  Covid Vaccine Patient has not covid vaccine.   Mother/ Baby Dyad Candidate?    If yes, offer as possibility  Informed patient of [facility] website  and placed link in her AVS.   Social Determinants of Health Food Insecurity: Patient denies food insecurity. WIC Referral: Patient is interested in referral to [facility].  Transportation: Patient denies transportation needs. Childcare: Discussed no children allowed at ultrasound appointments. Offered childcare services; patient declines childcare services at this time.  Send link to Pregnancy Navigators   Placed OB Box on problem list and updated  First visit review I reviewed new OB appt with pt. I explained she will have a pelvic exam, ob bloodwork with genetic screening, and PAP smear. Explained pt will be seen by Vonzella Nipple, PA-C at first visit; encounter routed to appropriate provider. Explained that patient will be seen by pregnancy navigator following visit with provider. [facility] information placed in AVS.   Henrietta Dine, CMA 09/10/2021  11:15 AM

## 2021-09-11 ENCOUNTER — Encounter: Payer: Self-pay | Admitting: Family Medicine

## 2021-09-11 NOTE — Progress Notes (Signed)
Chart reviewed for nurse visit. Agree with plan of care.   Marny Lowenstein, PA-C 09/11/2021 1:03 PM

## 2021-09-29 ENCOUNTER — Encounter: Payer: Self-pay | Admitting: Medical

## 2021-09-29 ENCOUNTER — Other Ambulatory Visit: Payer: Self-pay

## 2021-09-29 ENCOUNTER — Ambulatory Visit (INDEPENDENT_AMBULATORY_CARE_PROVIDER_SITE_OTHER): Payer: Medicaid Other | Admitting: Medical

## 2021-09-29 VITALS — BP 122/78 | HR 97 | Wt 134.9 lb

## 2021-09-29 DIAGNOSIS — Z98891 History of uterine scar from previous surgery: Secondary | ICD-10-CM

## 2021-09-29 DIAGNOSIS — Z23 Encounter for immunization: Secondary | ICD-10-CM | POA: Diagnosis not present

## 2021-09-29 DIAGNOSIS — Z3A20 20 weeks gestation of pregnancy: Secondary | ICD-10-CM

## 2021-09-29 DIAGNOSIS — N90810 Female genital mutilation status, unspecified: Secondary | ICD-10-CM

## 2021-09-29 DIAGNOSIS — O34219 Maternal care for unspecified type scar from previous cesarean delivery: Secondary | ICD-10-CM

## 2021-09-29 DIAGNOSIS — J302 Other seasonal allergic rhinitis: Secondary | ICD-10-CM

## 2021-09-29 DIAGNOSIS — O09522 Supervision of elderly multigravida, second trimester: Secondary | ICD-10-CM

## 2021-09-29 DIAGNOSIS — O0990 Supervision of high risk pregnancy, unspecified, unspecified trimester: Secondary | ICD-10-CM | POA: Diagnosis not present

## 2021-09-29 MED ORDER — ALBUTEROL SULFATE HFA 108 (90 BASE) MCG/ACT IN AERS: 2.0000 | INHALATION_SPRAY | Freq: Four times a day (QID) | RESPIRATORY_TRACT | 2 refills | Status: DC | PRN

## 2021-09-29 MED ORDER — BLOOD PRESSURE KIT DEVI: 1.0000 [IU] | 0 refills | Status: DC

## 2021-09-29 MED ORDER — GOJJI WEIGHT SCALE MISC: 1.0000 [IU] | 0 refills | Status: DC

## 2021-09-29 NOTE — Patient Instructions (Signed)

## 2021-09-29 NOTE — Progress Notes (Signed)
PRENATAL VISIT NOTE  Subjective:  Rebecca Carlson is a 40 y.o. G6P5005 at 77w4dbeing seen today for her first prenatal visit for this pregnancy.  She is currently monitored for the following issues for this high-risk pregnancy and has Female circumcision; Previous cesarean section--subsequent VBAC x 3; VBAC (vaginal birth after Cesarean); Cyst of right ovary; Supervision of high risk pregnancy, antepartum; and AMA (advanced maternal age) multigravida 35+, second trimester on their problem list.  Patient reports nausea and lack of appetite.  Contractions: Irritability. Vag. Bleeding: None.  Movement: Absent. Denies leaking of fluid.   She is planning to breastfeed. Desires Nexplanon for contraception.   The following portions of the patient's history were reviewed and updated as appropriate: allergies, current medications, past family history, past medical history, past social history, past surgical history and problem list.   Objective:   Vitals:   09/29/21 1523  BP: 122/78  Pulse: 97  Weight: 134 lb 14.4 oz (61.2 kg)    Fetal Status: Fetal Heart Rate (bpm): 161   Movement: Absent     General:  Alert, oriented and cooperative. Patient is in no acute distress.  Skin: Skin is warm and dry. No rash noted.   Cardiovascular: Normal heart rate and rhythm noted  Respiratory: Normal respiratory effort, no problems with respiration noted. Clear to auscultation.   Abdomen: Soft, gravid, appropriate for gestational age. Normal bowel sounds. Non-tender. Pain/Pressure: Present     Pelvic: Cervical exam deferred        Extremities: Normal range of motion.     Mental Status: Normal mood and affect. Normal behavior. Normal judgment and thought content.   Assessment and Plan:  Pregnancy: [id number] 266w4d. Supervision of high risk pregnancy, antepartum - Flu Vaccine QUAD 23m71mo (Fluarix, Fluzone & Alfiuria Quad PF) - CBC/D/Plt+RPR+Rh+ABO+RubIgG... - Genetic Screening - [facility] AMB  BABYSCRIPTS SCHEDULE OPTIMIZATION - Culture, OB Urine - Misc. Devices (GOJJI WEIGHT SCALE) MISC; 1 Units by Does not apply route once a week.  Dispense: 1 each; Refill: 0 - Blood Pressure Monitoring (BLOOD PRESSURE KIT) DEVI; 1 Units by Does not apply route once a week.  Dispense: 1 each; Refill: 0 - AMBULATORY REFERRAL TO [facility] FOOD PROGRAM - AFP, Serum, Open Spina Bifida - Anatomy US [place]/12/22  2. AMA (advanced maternal age) multigravida 35+72+econd trimester - Less than 40 65, no change in management   3. Previous cesarean section--subsequent VBAC x 3 - Planning TOLAC   4. VBAC (vaginal birth after Cesarean)  5. Female circumcision  6. Weight lose in pregnancy, second trimester - Dicsused Ensure for protein and small frequent meals  - Will continue to monitor  7. [redacted] weeks gestation of pregnancy  8. Seasonal allergies - albuterol (VENTOLIN HFA) 108 (90 Base) MCG/ACT inhaler; Inhale 2 puffs into the lungs every 6 (six) hours as needed for wheezing or shortness of breath.  Dispense: 8 g; Refill: 2  Preterm labor/first trimester warning symptoms and general obstetric precautions including but not limited to vaginal bleeding, contractions, leaking of fluid and fetal movement were reviewed in detail with the patient. Please refer to After Visit Summary for other counseling recommendations.   Discussed the normal visit cadence for prenatal care Discussed the nature of our practice with multiple providers including residents and students   Return in about 4 weeks (around 10/27/2021) [facility].  Future Appointments  Date Time Provider DepMountain Home AFB0/11/2021  2:30 PM [facility]/2021  2:45 PM  [facility] US6 [facility]    Julie Wenzel, PA-C  

## 2021-10-01 ENCOUNTER — Other Ambulatory Visit: Payer: Self-pay

## 2021-10-01 ENCOUNTER — Ambulatory Visit: Payer: Medicaid Other | Admitting: *Deleted

## 2021-10-01 ENCOUNTER — Other Ambulatory Visit: Payer: Self-pay | Admitting: *Deleted

## 2021-10-01 ENCOUNTER — Encounter: Payer: Self-pay | Admitting: *Deleted

## 2021-10-01 ENCOUNTER — Ambulatory Visit: Payer: Medicaid Other | Attending: Medical

## 2021-10-01 VITALS — BP 109/68 | HR 92

## 2021-10-01 DIAGNOSIS — O0990 Supervision of high risk pregnancy, unspecified, unspecified trimester: Secondary | ICD-10-CM

## 2021-10-01 DIAGNOSIS — O09522 Supervision of elderly multigravida, second trimester: Secondary | ICD-10-CM | POA: Insufficient documentation

## 2021-10-01 DIAGNOSIS — Z3A20 20 weeks gestation of pregnancy: Secondary | ICD-10-CM | POA: Diagnosis not present

## 2021-10-01 DIAGNOSIS — O0992 Supervision of high risk pregnancy, unspecified, second trimester: Secondary | ICD-10-CM

## 2021-10-01 DIAGNOSIS — J302 Other seasonal allergic rhinitis: Secondary | ICD-10-CM

## 2021-10-01 DIAGNOSIS — O34219 Maternal care for unspecified type scar from previous cesarean delivery: Secondary | ICD-10-CM

## 2021-10-01 DIAGNOSIS — Z3689 Encounter for other specified antenatal screening: Secondary | ICD-10-CM

## 2021-10-01 LAB — CBC/D/PLT+RPR+RH+ABO+RUBIGG...
Antibody Screen: NEGATIVE
Basophils Absolute: 0 10*3/uL (ref 0.0–0.2)
Basos: 0 %
EOS (ABSOLUTE): 0.8 10*3/uL — ABNORMAL HIGH (ref 0.0–0.4)
Eos: 6 %
HCV Ab: <0.1 s/co ratio (ref 0.0–0.9)
HIV Screen 4th Generation wRfx: NONREACTIVE
Hematocrit: 30.7 % — ABNORMAL LOW (ref 34.0–46.6)
Hemoglobin: 10.3 g/dL — ABNORMAL LOW (ref 11.1–15.9)
Hepatitis B Surface Ag: NEGATIVE
Immature Grans (Abs): 0.1 10*3/uL (ref 0.0–0.1)
Immature Granulocytes: 1 %
Lymphocytes Absolute: 2.2 10*3/uL (ref 0.7–3.1)
Lymphs: 17 %
MCH: 28.9 pg (ref 26.6–33.0)
MCHC: 33.6 g/dL (ref 31.5–35.7)
MCV: 86 fL (ref 79–97)
Monocytes Absolute: 0.9 10*3/uL (ref 0.1–0.9)
Monocytes: 7 %
Neutrophils Absolute: 8.8 10*3/uL — ABNORMAL HIGH (ref 1.4–7.0)
Neutrophils: 69 %
Platelets: 294 10*3/uL (ref 150–450)
RBC: 3.57 x10E6/uL — ABNORMAL LOW (ref 3.77–5.28)
RDW: 13 % (ref 11.7–15.4)
RPR Ser Ql: NONREACTIVE
Rh Factor: POSITIVE
Rubella Antibodies, IGG: 3.34 index (ref >0.99)
WBC: 12.8 10*3/uL — ABNORMAL HIGH (ref 3.4–10.8)

## 2021-10-01 LAB — AFP, SERUM, OPEN SPINA BIFIDA
AFP MoM: 1.08
AFP Value: 76 ng/mL
Gest. Age on Collection Date: 20 weeks
Maternal Age At EDD: 40.1 yr
OSBR Risk 1 IN: 10000
Test Results:: NEGATIVE
Weight: 134 [lb_av]

## 2021-10-01 LAB — CULTURE, OB URINE

## 2021-10-01 LAB — URINE CULTURE, OB REFLEX

## 2021-10-01 LAB — HCV INTERPRETATION

## 2021-10-01 MED ORDER — ALBUTEROL SULFATE HFA 108 (90 BASE) MCG/ACT IN AERS: 2.0000 | INHALATION_SPRAY | Freq: Four times a day (QID) | RESPIRATORY_TRACT | 2 refills | Status: DC | PRN

## 2021-10-29 ENCOUNTER — Ambulatory Visit (INDEPENDENT_AMBULATORY_CARE_PROVIDER_SITE_OTHER): Payer: Self-pay | Admitting: Obstetrics & Gynecology

## 2021-10-29 ENCOUNTER — Other Ambulatory Visit: Payer: Self-pay

## 2021-10-29 VITALS — BP 109/70 | HR 93 | Wt 138.7 lb

## 2021-10-29 DIAGNOSIS — O09522 Supervision of elderly multigravida, second trimester: Secondary | ICD-10-CM

## 2021-10-29 DIAGNOSIS — Z3A24 24 weeks gestation of pregnancy: Secondary | ICD-10-CM

## 2021-10-29 DIAGNOSIS — Z98891 History of uterine scar from previous surgery: Secondary | ICD-10-CM

## 2021-10-29 DIAGNOSIS — O0990 Supervision of high risk pregnancy, unspecified, unspecified trimester: Secondary | ICD-10-CM

## 2021-10-29 NOTE — Patient Instructions (Signed)
????? ???? ???????? ?? ???? ???? ??????? Oral Glucose Tolerance Test During Pregnancy ????? ????? ???? ?????? ???? ?????? ????? ????? ???? ???????? ?? ???? ???? (OGTT) ????? ????? ???? ?? ?????? ????? (????????). ??? ??? ?????? ?????????? ?????? ??? ?????? ???? ???? ?? ????? ????? (??? ?????? ??????). ???? ?????? ?????? ???? ???? ????? ?? ?????? ???? ??? ?????? ?? ????? ??????. ????? ??? ???? ???? ????? ?????? ?? ?????? ????? ??? ???????. ????? ?? ????? ????? ???? ?????? ?? ?????? ????? ?????? ???? ??? ????????? 24 ?28 ?? ?????. ?? ?????? ?????? ???? ???????? ?? ???? ???? ??????? (OGTT) ??? ????? ?????? ????? ??????? ???? ????? ??? ???? ????? ?? ????? ????? ????????? ??? ???? ??????? ???? ??? ?????? ??????? ??????? ??????. ??? ??????? ?????? ??? ???????? ????? ?? ??????? ???????:  ??? ?????? ??????? ??????.  ????? ?? ??? ???? ?????? ????? ???? ???? ?? ??????? ?? ????? ????? (????? ???? ???).  ???? ?????? ?????? ???? ?????? ?????? ???: ? ?????? ?? ?????. ? ?????? ??????? ?? ????? ?? ?????? ?? ???????. ? ???????? ?? ??????? ??????? ?????? ??????? ?? ???? ?? ????? ???. ???? ???? ????? ???? ??? ??????? ???? ???????? ?? ????? ?? ????? ?????? ??? ???? 3 ?????. ????? ??? ??? ???? ???? ??? ?????? ????????. ?? ??? ?????? ???? ???? ??????  ???? ??? ????? ?? ???? ???????. ????? ?? ???? ??? ??? ??????? ?? ???? ????? ???? ??? ??? ??????? ???????. ??? ????? ???? ??????  ?????? ????? ??????? ???? 3 ???? ??? ????? ????????. ??????? ?????? ?? ??????? ?????? ?????????????.  ????? ??????? ????? ??????? ?????? ??????? ??? ?????: ? ???? ???? ??? ????? ?????? ?? ?????? ?? ??? ????????. ?? ?????? ???? ??? ????? ??????? ?? ??????? ??? ????? (??????) ??? 8-10 ????? ?? ????? ????????. ? ????? ?? ????? ?????? ???????? ?????. ??? ??? ??????? ?? ???? ??? ????? ??? ???????. ????? ???? ??????? ?????? ??:  ???? ??????? ???? ????????? ??? ?? ??? ??????????? ???????? ?????? ????? ????????? ???????? ???? ????? ??? ????  ????.  ???? ???????? ???? ???? ????? ????.  ???? ???????? ???????? ???? ??? ?? ?????? ???.  ???? ??????? ?????? ???? ????? ????. ???? ???? ?? ????? ?????? ?????? ????? ???? ???? ?????? ???? ?????. ????? ??? ??? ???? ???? ???????? ?? ???? ???????? ????? ???? ????? ??? ???????. ?? ????????? ????? ?????? ????? ??? ???? ????? ?? ????????. ????? ???? ???? ?????? ???? ??? ???? ??? ???? ??????? ?3 ????? ?? ??????? ????? ????????. ?????? ????? ??? ??????? 3 ????? ???????. ????????? ??? ?????? ?? ???? ??????? ???? ??? ?????. ???? ???? ???????:  ?? ??????? ?? ??? ????? ????? ????????.  ?? ?????? ???????.  ?? ??????? ?????? ????? ??? ????????? ?? ????? ??? ??????? ???????? ??????????? ???? ?????. ???? ???????? ?? ???? ??? ????? ????????. ??????? ???? ??????? ?????? ??? ????? ??? ???????? ??????? ?? ???????. ?? ????? ????? ????? ??????? ?? ??? ???????? ??????? ?????? ???????????. ?? ??? ????? ????? ?????? ?????? ??????? ??? ?????????? ???????? ??? ??????? ?? ???? (???/???????) ?? ?????? ??? ??? (??????/???). ???? ???? ?? ???? ???? ?????? ???????? ?????? ???? ????? ?????? ???????? ????????? ?????? ???? ?????. ??????? ????? ??????? ?????? ??????? ??????? ???????? ???? ?? ????? ??? ?????? ?????? ????? ?? ??????? (????? ????????). ???? ?????? ?? ??? ????? ???????? ?? ?????? ??????? ????????? ???????????. ???? ??? ???? ????? ???????? ??????? ???? ??????? (???????-??????):  ?? ????? ??????: 95 ???/??????? (5.3 ???? ???/???).  ??? ???? ?????: 180 ???/??????? (10 ???? ???/???).  ??? ??????: 155 ???/??????? (8.6 ???? ???/???).  ??? 3 ?????: 140 ???/??????? (7.8 ???? ???/???). ???? ???? ???????? ??????? ????? ?? ??????? ???????? ???? ??????. ??? ??? ???? ??????? ?? ????? ???? ???????? ?? ????? ????????? ?? ??????? ??????? ????? ?????? ??????? ???????? ?? ???? ????? ??? ???? ????? ??????? ????? ?????. ??? ???? ????? ????? ??? ??????? ??? ????? ????? ??????? ?????? ????? ???????? ?? ????? ???????? ???? ??????  ???????. ??????? ???? ??????? ?????? ??????? ??? ????? ?? ????? ???????. ???? ??? ??? ??????? ?????? ??? ????? ??????? ?????? ???? ???? ??????? ?????? ?? ????? ??????? ?? ????? ?????:  ??? ???? ?????? ????????  ??? ???? ??? ????????  ?? ?????? ?????? ??????? ???  ?? ?????? ?????? ???? ????????  ?? ??????? ??????? ???? ????? ????????? ????  ????? ???? ???????? ?? ???? ???? (  OGTT) ??? ?????? ?????????? ?????? ??? ?????? ???? ???? ?? ????? ????? (??? ?????? ??????). ???? ?????? ?????? ???? ???? ????? ?? ?????? ???? ??? ?????? ?? ????? ??????.  ?? ?????? ?????? ???? ???????? ?? ???? ???? ??????? (OGTT) ??? ????? ?????? ?????? ?????? ???? ????? ??? ???? ????? ?? ????? ????? ????????? ??? ???? ??????? ???? ??? ?????? ??????? ??????? ??????. ??? ?????? ?? ????? ??? ???? ????? ????? ?? ????? ??? ?????? ???? ????? ?? ??????.  ??????? ???? ??????? ?????? ??????? ??? ????? ?? ????? ???????. ??? ????? ?? ??? ????????? ?? ???? ?????? ????????? ???? ?????? ???? ??????? ??????. ???? ?? ?????? ??? ????? ???? ?? ???? ?? ???? ??????? ??????.? Document Revised: 07/11/2020 Document Reviewed: 07/11/2020 Elsevier Patient Education  2022 [employer].

## 2021-10-29 NOTE — Progress Notes (Signed)
PRENATAL VISIT NOTE  Subjective:  Rebecca Carlson is a 40 y.o. G6P5005 at [redacted]w[redacted]d being seen today for ongoing prenatal care.  [employer] interpreter used for this encounter.  She is currently monitored for the following issues for this high-risk pregnancy and has Female circumcision; Previous cesarean section--subsequent VBAC x 3; Supervision of high risk pregnancy, antepartum; and AMA (advanced maternal age) multigravida 35+, second trimester on their problem list.  Patient reports no complaints.  Contractions: Irritability. Vag. Bleeding: None.  Movement: Present. Denies leaking of fluid.   The following portions of the patient's history were reviewed and updated as appropriate: allergies, current medications, past family history, past medical history, past social history, past surgical history and problem list.   Objective:   Vitals:   10/29/21 0844  BP: 109/70  Pulse: 93  Weight: 138 lb 11.2 oz (62.9 kg)    Fetal Status: Fetal Heart Rate (bpm): 150 Fundal Height: 25 cm Movement: Present     General:  Alert, oriented and cooperative. Patient is in no acute distress.  Skin: Skin is warm and dry. No rash noted.   Cardiovascular: Normal heart rate noted  Respiratory: Normal respiratory effort, no problems with respiration noted  Abdomen: Soft, gravid, appropriate for gestational age.  Pain/Pressure: Absent     Pelvic: Cervical exam deferred        Extremities: Normal range of motion.  Edema: None  Mental Status: Normal mood and affect. Normal behavior. Normal judgment and thought content.   Imaging: [place] MFM OB DETAIL +14 WK  Result Date: 10/01/2021 ----------------------------------------------------------------------  OBSTETRICS REPORT                       (Signed Final 10/01/2021 04:29 pm) ---------------------------------------------------------------------- Patient Info  ID #:       [id number]                          D.O.B.:  04-Oct-1981 (40 yrs)  Name:       Rebecca Carlson             Visit Date: 10/01/2021 02:55 pm ---------------------------------------------------------------------- Performed By  Attending:        Johnell Comings MD         Ref. Address:     [place],                                                             [place] [address]  Performed By:     Maryfrances Bunnell      [facility]                                                             [place] [facility]                                                             [facility]  Referred By:      Luvenia Redden                    PA ---------------------------------------------------------------------- Orders  #  Description                           Code        Ordered By  1  [place] MFM OB DETAIL +14 WK               76811.01    Kerry Hough ----------------------------------------------------------------------  #  Order #                     Accession #                Episode #  1  [id number]                   [id number]                 [id number] ---------------------------------------------------------------------- Indications  Advanced maternal age multigravida 10+,        O57.522  second trimester  History of cesarean delivery, currently        O34.219  pregnant  [redacted] weeks gestation of pregnancy                Z3A.20 ---------------------------------------------------------------------- Fetal Evaluation  Num Of Fetuses:         1  Fetal Heart Rate(bpm):  143  Cardiac Activity:       Observed  Presentation:           Cephalic  Placenta:               Posterior  P. Cord Insertion:      Visualized, central  Amniotic Fluid  AFI FV:      Within normal limits                              Largest Pocket(cm)                              5.5 ---------------------------------------------------------------------- Biometry  BPD:      50.5  mm     G. Age:  21w 2d          67  %    CI:        70.03   %    70 - 86  FL/HC:      19.1   %    15.9 - 20.3  HC:      192.5  mm     G. Age:  21w 3d         69  %    HC/AC:      1.14        1.06 - 1.25  AC:      168.9  mm     G. Age:  21w 6d         76  %    FL/BPD:     72.7   %  FL:       36.7  mm     G. Age:  21w 5d         70  %    FL/AC:      21.7   %    20 - 24  HUM:      33.5  mm     G. Age:  21w 2d         60  %  CER:      23.1  mm     G. Age:  21w 3d         82  %  NFT:       3.9  mm  LV:        6.2  mm  CM:        5.2  mm  Est. FW:     447  gm           1 lb     88  % ---------------------------------------------------------------------- OB History  Gravidity:    6         Term:   5 ---------------------------------------------------------------------- Gestational Age  LMP:           20w 6d        Date:  05/08/21                 EDD:   02/12/22  U/S Today:     21w 4d                                        EDD:   02/07/22  Best:          20w 6d     Det. By:  LMP  (05/08/21)          EDD:   02/12/22 ---------------------------------------------------------------------- Anatomy  Cranium:               Appears normal         LVOT:                   Appears normal  Cavum:                 Appears normal         Aortic Arch:            Appears normal  Ventricles:            Appears normal         Ductal Arch:            Appears normal  Choroid Plexus:        Appears normal         Diaphragm:  Appears normal  Cerebellum:            Appears normal         Stomach:                Appears normal, left                                                                        sided  Posterior Fossa:       Appears normal         Abdomen:                Appears normal  Nuchal Fold:           Appears normal         Abdominal Wall:         Appears nml (cord                                                                        insert, abd wall)  Face:                  Appears normal          Cord Vessels:           Appears normal (3                         (orbits and profile)                           vessel cord)  Lips:                  Appears normal         Kidneys:                Appear normal  Palate:                Appears normal         Bladder:                Appears normal  Thoracic:              Appears normal         Spine:                  Appears normal  Heart:                 Appears normal         Upper Extremities:      Appears normal                         (4CH, axis, and                         situs)  RVOT:  Appears normal         Lower Extremities:      Appears normal  Other:  Fetus appears to be female. Nasal bone, lenses, open hands/5th          digit, feet, heels, VC, 3VC, 3VT visualized. ---------------------------------------------------------------------- Cervix Uterus Adnexa  Cervix  Length:           4.24  cm.  Normal appearance by transabdominal scan.  Uterus  No abnormality visualized.  Right Ovary  Within normal limits.  Left Ovary  Not visualized.  Adnexa  No adnexal mass visualized. ---------------------------------------------------------------------- Comments  This patient was seen for a detailed fetal anatomy scan due  to advanced maternal age.  She denies any significant past medical history and denies  any problems in her current pregnancy.  She has not had any screening tests for fetal aneuploidy  drawn in her current pregnancy.  She was informed that the fetal growth and amniotic fluid  level were appropriate for her gestational age.  There were no obvious fetal anomalies noted on today's  ultrasound exam.  Today's exam was based on a review of the images obtained  by the sonographer, as I was not allowed to scan the patient  myself due to her religious beliefs.  The patient was informed that anomalies may be missed due  to technical limitations. If the fetus is in a suboptimal position  or maternal habitus is increased, visualization of  the fetus in  the maternal uterus may be impaired.  The increased risk of fetal aneuploidy due to advanced  maternal age was discussed. Due to advanced maternal age,  the patient was offered and declined an amniocentesis today  for definitive diagnosis of fetal aneuploidy.  Due to advanced maternal age, a follow-up exam was  scheduled in 6 weeks.  All conversations were held with the patient today with the  help of an Arabic interpreter. ----------------------------------------------------------------------                   Ma Rings, MD Electronically Signed Final Report   10/01/2021 04:29 pm ----------------------------------------------------------------------   Assessment and Plan:  Pregnancy: [id number] at [redacted]w[redacted]d 1. AMA (advanced maternal age) multigravida 35+, second trimester Getting serial scans as per MFM, antenatal testing later in third trimester.  2. Previous cesarean section--subsequent VBAC x 3 Counseled regarding TOLAC vs RCS; risks/benefits discussed in detail. All questions answered.  Patient elects for TOLAC, consent signed 10/29/2021.  3. [redacted] weeks gestation of pregnancy 4. Supervision of high risk pregnancy, antepartum Preterm labor symptoms and general obstetric precautions including but not limited to vaginal bleeding, contractions, leaking of fluid and fetal movement were reviewed in detail with the patient. Please refer to After Visit Summary for other counseling recommendations.   Return in about 3 weeks (around 11/19/2021) for 2 hr GTT, 3rd trimester labs, TDap, OFFICE OB VISIT (MD or APP).  Future Appointments  Date Time Provider Department Center  11/12/2021  9:30 AM [facility] NURSE [facility]  11/12/2021  9:45 AM [facility] US5 [facility]  11/20/2021  8:20 AM [facility] LAB [facility]  11/20/2021  9:35 AM Sharod Petsch, Jethro Bastos, MD [facility]    Jaynie Collins, MD

## 2021-11-12 ENCOUNTER — Ambulatory Visit: Payer: Medicaid Other | Admitting: *Deleted

## 2021-11-12 ENCOUNTER — Encounter: Payer: Self-pay | Admitting: *Deleted

## 2021-11-12 ENCOUNTER — Other Ambulatory Visit: Payer: Self-pay | Admitting: *Deleted

## 2021-11-12 ENCOUNTER — Ambulatory Visit: Payer: Medicaid Other | Attending: Obstetrics

## 2021-11-12 ENCOUNTER — Other Ambulatory Visit: Payer: Self-pay

## 2021-11-12 VITALS — BP 103/64 | HR 89

## 2021-11-12 DIAGNOSIS — Z3A26 26 weeks gestation of pregnancy: Secondary | ICD-10-CM

## 2021-11-12 DIAGNOSIS — O09522 Supervision of elderly multigravida, second trimester: Secondary | ICD-10-CM

## 2021-11-12 DIAGNOSIS — O0990 Supervision of high risk pregnancy, unspecified, unspecified trimester: Secondary | ICD-10-CM | POA: Insufficient documentation

## 2021-11-12 DIAGNOSIS — O34219 Maternal care for unspecified type scar from previous cesarean delivery: Secondary | ICD-10-CM

## 2021-11-18 ENCOUNTER — Other Ambulatory Visit: Payer: Self-pay | Admitting: *Deleted

## 2021-11-18 DIAGNOSIS — O0990 Supervision of high risk pregnancy, unspecified, unspecified trimester: Secondary | ICD-10-CM

## 2021-11-20 ENCOUNTER — Other Ambulatory Visit: Payer: Medicaid Other

## 2021-11-20 ENCOUNTER — Ambulatory Visit (INDEPENDENT_AMBULATORY_CARE_PROVIDER_SITE_OTHER): Payer: Medicaid Other | Admitting: Obstetrics & Gynecology

## 2021-11-20 ENCOUNTER — Encounter: Payer: Self-pay | Admitting: Obstetrics & Gynecology

## 2021-11-20 ENCOUNTER — Other Ambulatory Visit: Payer: Self-pay

## 2021-11-20 ENCOUNTER — Encounter: Payer: Self-pay | Admitting: Family Medicine

## 2021-11-20 VITALS — BP 112/65 | HR 101 | Wt 141.2 lb

## 2021-11-20 DIAGNOSIS — Z98891 History of uterine scar from previous surgery: Secondary | ICD-10-CM

## 2021-11-20 DIAGNOSIS — O0990 Supervision of high risk pregnancy, unspecified, unspecified trimester: Secondary | ICD-10-CM

## 2021-11-20 DIAGNOSIS — Z3A28 28 weeks gestation of pregnancy: Secondary | ICD-10-CM

## 2021-11-20 DIAGNOSIS — O09523 Supervision of elderly multigravida, third trimester: Secondary | ICD-10-CM

## 2021-11-20 DIAGNOSIS — Z23 Encounter for immunization: Secondary | ICD-10-CM

## 2021-11-20 MED ORDER — PRENATAL PLUS 27-1 MG PO TABS: 1.0000 | ORAL_TABLET | Freq: Every day | ORAL | 11 refills | Status: DC

## 2021-11-20 NOTE — Progress Notes (Signed)
PRENATAL VISIT NOTE  Subjective:  Rebecca Carlson is a 40 y.o. G6P5005 at [redacted]w[redacted]d being seen today for ongoing prenatal care. Patient is Arabic-speaking only, interpreter present for this encounter.  She is currently monitored for the following issues for this high-risk pregnancy and has Female circumcision; Previous cesarean section--subsequent VBAC x 3; Supervision of high risk pregnancy, antepartum; and AMA (advanced maternal age) multigravida 35+, third trimester on their problem list.  Patient reports no complaints.  Contractions: Not present. Vag. Bleeding: None.  Movement: Present. Denies leaking of fluid.   The following portions of the patient's history were reviewed and updated as appropriate: allergies, current medications, past family history, past medical history, past social history, past surgical history and problem list.   Objective:   Vitals:   11/20/21 0937  BP: 112/65  Pulse: (!) 101  Weight: 141 lb 3.2 oz (64 kg)    Fetal Status: Fetal Heart Rate (bpm): 147   Movement: Present     General:  Alert, oriented and cooperative. Patient is in no acute distress.  Skin: Skin is warm and dry. No rash noted.   Cardiovascular: Normal heart rate noted  Respiratory: Normal respiratory effort, no problems with respiration noted  Abdomen: Soft, gravid, appropriate for gestational age.  Pain/Pressure: Absent     Pelvic: Cervical exam deferred        Extremities: Normal range of motion.  Edema: None  Mental Status: Normal mood and affect. Normal behavior. Normal judgment and thought content.   Imaging: [place] MFM OB FOLLOW UP  Result Date: 11/12/2021 ----------------------------------------------------------------------  OBSTETRICS REPORT                       (Signed Final 11/12/2021 10:28 am) ---------------------------------------------------------------------- Patient Info  ID #:       [id number]                          D.O.B.:  1981/06/14 (40 yrs)  Name:       Rebecca Carlson             Visit Date: 11/12/2021 09:32 am ---------------------------------------------------------------------- Performed By  Attending:        Lin Landsman      Ref. Address:     [address]                    [place]                                                             [address] [place],                                                             [place] [address]  Performed By:     Eden Lathe BS      [facility]                                                             [place] [facility]                                                             Women  Referred By:      Luvenia Redden                    [place] ---------------------------------------------------------------------- Orders  #  Description                           Code        Ordered By  1  [place] MFM OB FOLLOW UP                   [phone]    Peterson Ao ----------------------------------------------------------------------  #  Order #                     Accession #                Episode #  1  [id number]                   [id number]                 [id number] ---------------------------------------------------------------------- Indications  [redacted] weeks gestation of pregnancy                Z3A.26  Advanced maternal age multigravida 67 by       O09.522  delivery, second trimester  History of cesarean delivery, currently        O34.219  pregnant (4 VBAC since)  Encounter for other antenatal screening        Z36.2  follow-up  Low risk NIPS, neg AFP/Horizon 4 ---------------------------------------------------------------------- Fetal Evaluation  Num Of Fetuses:         1  Fetal Heart Rate(bpm):  140  Cardiac Activity:       Observed  Presentation:           Cephalic  Placenta:               Posterior  P. Cord Insertion:      Previously Visualized  Amniotic Fluid  AFI FV:      Within normal limits                              Largest Pocket(cm)                               4.8 ---------------------------------------------------------------------- Biometry  BPD:        68  mm     G. Age:  27w 3d         57  %    CI:        70.78   %  70 - 86                                                          FL/HC:      19.6   %    18.6 - 20.4  HC:      257.6  mm     G. Age:  28w 0d         61  %    HC/AC:      1.04        1.05 - 1.21  AC:      246.7  mm     G. Age:  28w 6d         92  %    FL/BPD:     74.4   %    71 - 87  FL:       50.6  mm     G. Age:  27w 1d         44  %    FL/AC:      20.5   %    20 - 24  LV:        3.1  mm  Est. FW:    1179  gm    2 lb 10 oz      87  % ---------------------------------------------------------------------- OB History  Gravidity:    6         Term:   5 ---------------------------------------------------------------------- Gestational Age  LMP:           26w 6d        Date:  05/08/21                 EDD:   02/12/22  U/S Today:     27w 6d                                        EDD:   02/05/22  Best:          26w 6d     Det. By:  LMP  (05/08/21)          EDD:   02/12/22 ---------------------------------------------------------------------- Anatomy  Cranium:               Appears normal         LVOT:                   Appears normal  Cavum:                 Appears normal         Aortic Arch:            Previously seen  Ventricles:            Appears normal         Ductal Arch:            Previously seen  Choroid Plexus:        Appears normal         Diaphragm:              Appears normal  Cerebellum:            Appears normal  Stomach:                Appears normal, left                                                                        sided  Posterior Fossa:       Appears normal         Abdomen:                Appears normal  Nuchal Fold:           Previously seen        Abdominal Wall:         Appears nml (cord                                                                        insert, abd wall)  Face:                   Appears normal         Cord Vessels:           Appears normal (3                         (orbits and profile)                           vessel cord)  Lips:                  Appears normal         Kidneys:                Appear normal  Palate:                Previously seen        Bladder:                Appears normal  Thoracic:              Appears normal         Spine:                  Previously seen  Heart:                 Appears normal         Upper Extremities:      Previously seen                         (4CH, axis, and                         situs)  RVOT:                  Appears normal         Lower Extremities:      Previously seen  Other:  Female gender previously seen. Open hands/5th digit, feet, heels,          and VC previously visualized, Nasal bone, lenses, 3VC and 3VT          visualized. ---------------------------------------------------------------------- Cervix Uterus Adnexa  Cervix  Length:           3.83  cm.  Normal appearance by transabdominal scan.  Uterus  No abnormality visualized.  Right Ovary  Within normal limits.  Left Ovary  Not visualized.  Cul De Sac  No free fluid seen.  Adnexa  No abnormality visualized. ---------------------------------------------------------------------- Impression  Follow up growth due to advance maternal age will be 40 yo  at the time of delivery.  Normal anatomy with measurements consistent with dates,  again no additional markers of aneuploidy were visualized.  There is good fetal movement and amniotic fluid volume  I discussed the increased risk for fetal growth delays and  recommend serial growth exams (32/36 weeks) with  antenatal surveillance being initiated at 36 weeks. ---------------------------------------------------------------------- Recommendations  Follow up growth was scheduled at 32 weeks. ----------------------------------------------------------------------               Sander Nephew, MD Electronically Signed Final Report    11/12/2021 10:28 am ----------------------------------------------------------------------   Assessment and Plan:  Pregnancy: [id number] at [redacted]w[redacted]d 1. Previous cesarean section--subsequent VBAC x 3 TOLAC consent signed 10/29/21  2. AMA (advanced maternal age) multigravida 45+, third trimester Antenatal testing to start at 39 weeks.  3. [redacted] weeks gestation of pregnancy 4. Supervision of high risk pregnancy, antepartum Third trimester labs done today.  Tdap given.  - prenatal vitamin w/FE, FA (PRENATAL 1 + 1) 27-1 MG TABS tablet; Take 1 tablet by mouth daily at 12 noon.  Dispense: 30 tablet; Refill: 11 Preterm labor symptoms and general obstetric precautions including but not limited to vaginal bleeding, contractions, leaking of fluid and fetal movement were reviewed in detail with the patient. Please refer to After Visit Summary for other counseling recommendations.   Return in about 2 weeks (around 12/04/2021) for OFFICE OB VISIT (MD or APP).  Future Appointments  Date Time Provider [place]  12/24/2021 10:45 AM [facility] NURSE [facility]  12/24/2021 11:00 AM [facility] US1 [facility] [place]    Verita Schneiders, MD

## 2021-11-21 ENCOUNTER — Other Ambulatory Visit: Payer: Self-pay | Admitting: Obstetrics & Gynecology

## 2021-11-21 DIAGNOSIS — O24419 Gestational diabetes mellitus in pregnancy, unspecified control: Secondary | ICD-10-CM

## 2021-11-21 DIAGNOSIS — O24415 Gestational diabetes mellitus in pregnancy, controlled by oral hypoglycemic drugs: Secondary | ICD-10-CM | POA: Insufficient documentation

## 2021-11-21 DIAGNOSIS — O99013 Anemia complicating pregnancy, third trimester: Secondary | ICD-10-CM | POA: Insufficient documentation

## 2021-11-21 DIAGNOSIS — Z8632 Personal history of gestational diabetes: Secondary | ICD-10-CM | POA: Insufficient documentation

## 2021-11-21 DIAGNOSIS — O0990 Supervision of high risk pregnancy, unspecified, unspecified trimester: Secondary | ICD-10-CM

## 2021-11-21 DIAGNOSIS — D509 Iron deficiency anemia, unspecified: Secondary | ICD-10-CM | POA: Insufficient documentation

## 2021-11-21 LAB — CBC
Hematocrit: 32 % — ABNORMAL LOW (ref 34.0–46.6)
Hemoglobin: 10.6 g/dL — ABNORMAL LOW (ref 11.1–15.9)
MCH: 28.8 pg (ref 26.6–33.0)
MCHC: 33.1 g/dL (ref 31.5–35.7)
MCV: 87 fL (ref 79–97)
Platelets: 264 10*3/uL (ref 150–450)
RBC: 3.68 x10E6/uL — ABNORMAL LOW (ref 3.77–5.28)
RDW: 12.7 % (ref 11.7–15.4)
WBC: 14.3 10*3/uL — ABNORMAL HIGH (ref 3.4–10.8)

## 2021-11-21 LAB — HIV ANTIBODY (ROUTINE TESTING W REFLEX): HIV Screen 4th Generation wRfx: NONREACTIVE

## 2021-11-21 LAB — GLUCOSE TOLERANCE, 2 HOURS W/ 1HR
Glucose, 1 hour: 203 mg/dL — ABNORMAL HIGH (ref 70–179)
Glucose, 2 hour: 209 mg/dL — ABNORMAL HIGH (ref 70–152)
Glucose, Fasting: 96 mg/dL — ABNORMAL HIGH (ref 70–91)

## 2021-11-21 LAB — RPR: RPR Ser Ql: NONREACTIVE

## 2021-11-21 MED ORDER — FERROUS SULFATE 325 (65 FE) MG PO TABS: 325.0000 mg | ORAL_TABLET | ORAL | 3 refills | Status: DC

## 2021-11-24 ENCOUNTER — Telehealth: Payer: Self-pay

## 2021-11-24 DIAGNOSIS — O24410 Gestational diabetes mellitus in pregnancy, diet controlled: Secondary | ICD-10-CM

## 2021-11-24 NOTE — Telephone Encounter (Addendum)
-----   Message from Tereso Newcomer, MD sent at 11/21/2021  9:31 AM EST ----- Mild anemia noted, oral iron therapy prescribed. Please call to inform patient of results and advise her to pick up prescription and take as directed. Speaks Arabic only.   Tereso Newcomer, MD  P [facility] Clinical Pool Patient has GDM. Referral to DM education ordered.  Please call to inform patient of results. Arabic speaking

## 2021-11-24 NOTE — Telephone Encounter (Signed)
Call placed to pt with [facility] Interpreter # [phone]. Pt did not answer and VM left for pt to return call to office for results.  Laney Pastor

## 2021-11-26 NOTE — Telephone Encounter (Signed)
Called pt at mobile number with [facility] interpreter ID [phone]. VM left stating I am calling with new results. This was third attempt to contact pt.   Called pt at home number with [facility] interpreter ID [phone]. VM left stating I am calling with results and requesting a call back. Will review at next appt.

## 2021-12-01 MED ORDER — ACCU-CHEK GUIDE VI STRP: ORAL_STRIP | 6 refills | Status: DC

## 2021-12-01 MED ORDER — ACCU-CHEK SOFTCLIX LANCETS MISC: 6 refills | Status: DC

## 2021-12-01 MED ORDER — ACCU-CHEK GUIDE W/DEVICE KIT: 1.0000 | PACK | Freq: Once | 0 refills | Status: AC

## 2021-12-01 NOTE — Telephone Encounter (Signed)
Patient returned call to office. Called pt back with [facility] interpreter ID [phone]. Results and new appt reviewed with patient. Glucose testing supplies sent to pharmacy. Instructed pt to pick up prior to appt along with prenatal vitamin and iron supplement.

## 2021-12-01 NOTE — Addendum Note (Signed)
Addended by: Marjo Bicker on: 12/01/2021 04:12 PM   Modules accepted: Orders

## 2021-12-04 ENCOUNTER — Encounter: Payer: Medicaid Other | Attending: Obstetrics & Gynecology | Admitting: Registered"

## 2021-12-04 ENCOUNTER — Ambulatory Visit: Payer: Medicaid Other | Admitting: Registered"

## 2021-12-04 ENCOUNTER — Other Ambulatory Visit: Payer: Self-pay

## 2021-12-04 DIAGNOSIS — O24419 Gestational diabetes mellitus in pregnancy, unspecified control: Secondary | ICD-10-CM | POA: Insufficient documentation

## 2021-12-04 DIAGNOSIS — Z3A00 Weeks of gestation of pregnancy not specified: Secondary | ICD-10-CM | POA: Insufficient documentation

## 2021-12-04 NOTE — Progress Notes (Signed)
In-person interpreter Nuha from [employer]  Patient was seen for Gestational Diabetes self-management on 12/04/21  Start time 0940 and End time 1045   Estimated due date: 02/12/22; [redacted]w[redacted]d  Clinical: Medications: prenatal, Ferrousul (pt was not aware of Rx), claritin, albuterol Medical History: reviewed Labs: OGTT 96(H)-203(H)-209(H), A1c n/a%   Dietary and Lifestyle History: Pt states she has family history of diabetes. Pt states she stopped eating sweets early in the pregnancies because they made her not feel good.  Physical Activity: ADL Stress: not assessed (was feeling stress over diagnosis) Sleep: not assessed  24 hr Recall: not assessed First Meal: Snack: Second meal: Snack: Third meal: Snack: Beverages:  NUTRITION INTERVENTION  Nutrition education (E-1) on the following topics:   Initial Follow-up  [x]  []  Definition of Gestational Diabetes []  []  Why dietary management is important in controlling blood glucose [x]  []  Effects each nutrient has on blood glucose levels []  []  Simple carbohydrates vs complex carbohydrates []  []  Fluid intake [x]  []  Creating a balanced meal plan [x]  []  Carbohydrate counting  [x]  []  When to check blood glucose levels [x]  []  Proper blood glucose monitoring techniques [x]  []  Effect of stress and stress reduction techniques  [x]  []  Exercise effect on blood glucose levels, appropriate exercise during pregnancy []  []  Importance of limiting caffeine and abstaining from alcohol and smoking []  []  Medications used for blood sugar control during pregnancy [x]  []  Hypoglycemia and rule of 15 []  []  Postpartum self care  Blood glucose monitor given: Accu-chek Guide Me Lot Exp: 01/30/2023 CBG: 119 mg/dL & mg/dL  Patient checked 2x to make sure she understood the steps of SMBG. The discrepancies between the two values was unexpected. RD discussed factors that can make the readings inaccurate such as hands not clean, contaminated  strips, sweat or wet hands.  Patient instructed to monitor glucose levels: FBS: 60 - ? 95 mg/dL (some clinics use 90 for cutoff) 1 hour: ? 140 mg/dL 2 hour: ? mg/dL  Patient received handouts: Nutrition Diabetes and Pregnancy Carbohydrate Counting List  Patient will be seen for follow-up as needed.

## 2021-12-08 ENCOUNTER — Encounter: Payer: Self-pay | Admitting: Obstetrics and Gynecology

## 2021-12-08 ENCOUNTER — Other Ambulatory Visit: Payer: Self-pay

## 2021-12-08 ENCOUNTER — Ambulatory Visit (INDEPENDENT_AMBULATORY_CARE_PROVIDER_SITE_OTHER): Payer: Medicaid Other | Admitting: Obstetrics and Gynecology

## 2021-12-08 VITALS — BP 109/60 | HR 87 | Wt 140.0 lb

## 2021-12-08 DIAGNOSIS — Z98891 History of uterine scar from previous surgery: Secondary | ICD-10-CM

## 2021-12-08 DIAGNOSIS — Z3A30 30 weeks gestation of pregnancy: Secondary | ICD-10-CM

## 2021-12-08 DIAGNOSIS — Z789 Other specified health status: Secondary | ICD-10-CM

## 2021-12-08 DIAGNOSIS — O99013 Anemia complicating pregnancy, third trimester: Secondary | ICD-10-CM

## 2021-12-08 DIAGNOSIS — O09523 Supervision of elderly multigravida, third trimester: Secondary | ICD-10-CM

## 2021-12-08 DIAGNOSIS — O0990 Supervision of high risk pregnancy, unspecified, unspecified trimester: Secondary | ICD-10-CM

## 2021-12-08 DIAGNOSIS — O24419 Gestational diabetes mellitus in pregnancy, unspecified control: Secondary | ICD-10-CM

## 2021-12-08 MED ORDER — BENZONATATE 100 MG PO CAPS: 100.0000 mg | ORAL_CAPSULE | Freq: Three times a day (TID) | ORAL | 1 refills | Status: DC | PRN

## 2021-12-08 NOTE — Progress Notes (Signed)
° ° °  PRENATAL VISIT NOTE  Subjective:  Rebecca Carlson is a 40 y.o. G6P5005 at [redacted]w[redacted]d being seen today for ongoing prenatal care.  She is currently monitored for the following issues for this high-risk pregnancy and has Female circumcision; Previous cesarean section--subsequent VBAC x 3; Supervision of high risk pregnancy, antepartum; AMA (advanced maternal age) multigravida 35+, third trimester; Anemia affecting pregnancy in third trimester; Gestational diabetes mellitus (GDM) in third trimester; and Language barrier on their problem list.  Patient reports no complaints.  Contractions: Irritability. Vag. Bleeding: None.  Movement: Present. Denies leaking of fluid.   The following portions of the patient's history were reviewed and updated as appropriate: allergies, current medications, past family history, past medical history, past social history, past surgical history and problem list.   Objective:   Vitals:   12/08/21 0913  BP: 109/60  Pulse: 87  Weight: 140 lb (63.5 kg)    Fetal Status: Fetal Heart Rate (bpm): 130   Movement: Present     General:  Alert, oriented and cooperative. Patient is in no acute distress.  Skin: Skin is warm and dry. No rash noted.   Cardiovascular: Normal heart rate noted  Respiratory: Normal respiratory effort, no problems with respiration noted  Abdomen: Soft, gravid, appropriate for gestational age.  Pain/Pressure: Absent     Pelvic: Cervical exam deferred        Extremities: Normal range of motion.  Edema: None  Mental Status: Normal mood and affect. Normal behavior. Normal judgment and thought content.   Assessment and Plan:  Pregnancy: G6P5005 at [redacted]w[redacted]d 1. Supervision of high risk pregnancy, antepartum Routine care. Desires inpatient nexplanon  2. [redacted] weeks gestation of pregnancy  3. Language barrier Interpreter used  4. AMA (advanced maternal age) multigravida 35+, third trimester No current issues Delivery at 39wks at the  latest  5. Previous cesarean section--subsequent VBAC x 3 S/p vbac consent already  6. Gestational diabetes mellitus (GDM) in third trimester, gestational diabetes method of control unspecified Normal CBG log F/u 1/4 surveillance u/s  Preterm labor symptoms and general obstetric precautions including but not limited to vaginal bleeding, contractions, leaking of fluid and fetal movement were reviewed in detail with the patient. Please refer to After Visit Summary for other counseling recommendations.   Return in 16 days (on 12/24/2021) for in person, md visit, high risk ob.  Future Appointments  Date Time Provider Department Center  12/24/2021  9:35 AM Warden Fillers, MD [facility]  12/24/2021 10:45 AM [facility] NURSE [facility]  12/24/2021 11:00 AM [facility] US1 [facility]    [name], MD

## 2021-12-21 NOTE — L&D Delivery Note (Signed)
OB/GYN Faculty Practice Delivery Note  Rebecca Carlson is a 41 y.o. [id number] s/p VBAC/SVD at [redacted]w[redacted]d. She was admitted for SOL and TOLAC.   ROM: 2h 52m with clear fluid GBS Status: Negative Maximum Maternal Temperature: 98.8  Labor Progress: Presented with contractions and found to be 4 cm, then was expectantly managed, and ultimately her membranes were artificially ruptured and she progressed to complete.  Delivery Date/Time: 01/28/2022 at [phone] Delivery: Called to room and patient was complete and pushing. 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 Head at +4 but tight neo-introitus despite pushing. At that time using iPAD Arabic interpreter discussed with patient and offered de-infibulation along the medial aspect of her circumcision scar and patient amenable. During beginning of next contraction, tented skin up with two gloved fingers and used mayo scissors to make 0.5 cm cut at midline of scar.   With next push remainder of the scar spontaneously extended up the medial plane to the superior edge and head delivered OA. Loose nuchal cord present and reduced. Shoulder and body delivered in usual fashion. Infant with spontaneous cry, placed on mother's abdomen, dried and stimulated. Cord clamped x 2 after 1-minute delay, and cut by RN student. Cord blood drawn. Placenta delivered spontaneously with gentle cord traction. Fundus firm with massage and Pitocin. Labia, perineum, vagina, and cervix inspected and no perineal or periurethral lacerations.  At that time attention was turned to her FGC scar that was de-infibulated prior to delivery. Per patient's wishes the scar was re-approximated to original state using a layer of 3-0 vicryl running stitches along the dermis and then the overlying epidermal skin layer was brought together using 4-0 monocryl in subcuticular fashion.   Placenta: Placenta intact, 3V cord, L&D Complications: None Lacerations: No  perineal lacerations, deinfibulated FGC scar for delivery was re-approximated per patient's wishes. EBL: 150cc Analgesia: epidural    Infant: female   APGARs 8,9    weight 3095 gm  Renard Matter, MD, MPH OB Fellow, [facility] for [facility], [place] [facility] 01/28/2022, 10:28 AM

## 2021-12-24 ENCOUNTER — Other Ambulatory Visit: Payer: Self-pay | Admitting: *Deleted

## 2021-12-24 ENCOUNTER — Ambulatory Visit (INDEPENDENT_AMBULATORY_CARE_PROVIDER_SITE_OTHER): Payer: Medicaid Other | Admitting: Obstetrics and Gynecology

## 2021-12-24 ENCOUNTER — Encounter: Payer: Self-pay | Admitting: *Deleted

## 2021-12-24 ENCOUNTER — Ambulatory Visit: Payer: Medicaid Other | Attending: Maternal & Fetal Medicine

## 2021-12-24 ENCOUNTER — Ambulatory Visit: Payer: Medicaid Other | Admitting: *Deleted

## 2021-12-24 ENCOUNTER — Other Ambulatory Visit: Payer: Self-pay

## 2021-12-24 ENCOUNTER — Other Ambulatory Visit: Payer: Self-pay | Admitting: Maternal & Fetal Medicine

## 2021-12-24 VITALS — BP 98/52 | HR 86

## 2021-12-24 VITALS — BP 107/53 | HR 85 | Wt 140.0 lb

## 2021-12-24 DIAGNOSIS — Z3A32 32 weeks gestation of pregnancy: Secondary | ICD-10-CM | POA: Insufficient documentation

## 2021-12-24 DIAGNOSIS — O24415 Gestational diabetes mellitus in pregnancy, controlled by oral hypoglycemic drugs: Secondary | ICD-10-CM

## 2021-12-24 DIAGNOSIS — Z789 Other specified health status: Secondary | ICD-10-CM

## 2021-12-24 DIAGNOSIS — O09522 Supervision of elderly multigravida, second trimester: Secondary | ICD-10-CM | POA: Insufficient documentation

## 2021-12-24 DIAGNOSIS — O0990 Supervision of high risk pregnancy, unspecified, unspecified trimester: Secondary | ICD-10-CM | POA: Insufficient documentation

## 2021-12-24 DIAGNOSIS — N90810 Female genital mutilation status, unspecified: Secondary | ICD-10-CM

## 2021-12-24 DIAGNOSIS — O09523 Supervision of elderly multigravida, third trimester: Secondary | ICD-10-CM | POA: Insufficient documentation

## 2021-12-24 DIAGNOSIS — O34219 Maternal care for unspecified type scar from previous cesarean delivery: Secondary | ICD-10-CM | POA: Diagnosis not present

## 2021-12-24 DIAGNOSIS — Z98891 History of uterine scar from previous surgery: Secondary | ICD-10-CM

## 2021-12-24 DIAGNOSIS — O24410 Gestational diabetes mellitus in pregnancy, diet controlled: Secondary | ICD-10-CM

## 2021-12-24 MED ORDER — METFORMIN HCL 500 MG PO TABS: 500.0000 mg | ORAL_TABLET | Freq: Two times a day (BID) | ORAL | 5 refills | Status: DC

## 2021-12-24 NOTE — Progress Notes (Signed)
° °  PRENATAL VISIT NOTE  Subjective:  Rebecca Carlson is a 41 y.o. G6P5005 at [redacted]w[redacted]d being seen today for ongoing prenatal care.  She is currently monitored for the following issues for this high-risk pregnancy and has Female circumcision; Previous cesarean section--subsequent VBAC x 3; Supervision of high risk pregnancy, antepartum; AMA (advanced maternal age) multigravida 35+, third trimester; Anemia affecting pregnancy in third trimester; Gestational diabetes mellitus (GDM) in third trimester; Language barrier; and [redacted] weeks gestation of pregnancy on their problem list.  Patient doing well with no acute concerns today. She reports no complaints.  Contractions: Irritability. Vag. Bleeding: None.  Movement: Present. Denies leaking of fluid.   The following portions of the patient's history were reviewed and updated as appropriate: allergies, current medications, past family history, past medical history, past social history, past surgical history and problem list. Problem list updated.  Objective:   Vitals:   12/24/21 0954  BP: (!) 107/53  Pulse: 85  Weight: 140 lb (63.5 kg)    Fetal Status: Fetal Heart Rate (bpm): 130 Fundal Height: 32 cm Movement: Present     General:  Alert, oriented and cooperative. Patient is in no acute distress.  Skin: Skin is warm and dry. No rash noted.   Cardiovascular: Normal heart rate noted  Respiratory: Normal respiratory effort, no problems with respiration noted  Abdomen: Soft, gravid, appropriate for gestational age.  Pain/Pressure: Absent     Pelvic: Cervical exam deferred        Extremities: Normal range of motion.  Edema: None  Mental Status:  Normal mood and affect. Normal behavior. Normal judgment and thought content.   Assessment and Plan:  Pregnancy: G6P5005 at [redacted]w[redacted]d  1. [redacted] weeks gestation of pregnancy   2. Diet controlled gestational diabetes mellitus (GDM) in third trimester FBS: 86-119 PPBS: 90-158 Will initiate metformin 500 mg  po BID Start weekly BPP  3. Supervision of high risk pregnancy, antepartum Continue routine care, discussed net weight gain of 6 pounds this pregnancy  4. Female circumcision   5. Previous cesarean section--subsequent VBAC x 3 Pt desires VBAC, consent signed  6. Language barrier Live interpreter present  7. AMA (advanced maternal age) multigravida 35+, third trimester Growth scan today  Preterm labor symptoms and general obstetric precautions including but not limited to vaginal bleeding, contractions, leaking of fluid and fetal movement were reviewed in detail with the patient.  Please refer to After Visit Summary for other counseling recommendations.   Return in about 2 weeks (around 01/07/2022) for [facility], in person.   Mariel Aloe, MD Faculty Attending Center for [facility]

## 2021-12-31 ENCOUNTER — Ambulatory Visit (INDEPENDENT_AMBULATORY_CARE_PROVIDER_SITE_OTHER): Payer: Medicaid Other

## 2021-12-31 ENCOUNTER — Ambulatory Visit: Payer: Medicaid Other | Admitting: *Deleted

## 2021-12-31 ENCOUNTER — Other Ambulatory Visit: Payer: Self-pay

## 2021-12-31 VITALS — BP 100/66 | HR 93 | Wt 139.1 lb

## 2021-12-31 DIAGNOSIS — O24415 Gestational diabetes mellitus in pregnancy, controlled by oral hypoglycemic drugs: Secondary | ICD-10-CM

## 2021-12-31 DIAGNOSIS — O09523 Supervision of elderly multigravida, third trimester: Secondary | ICD-10-CM

## 2021-12-31 MED ORDER — SPACER/AERO-HOLDING CHAMBERS DEVI: 0 refills | Status: DC

## 2021-12-31 NOTE — Addendum Note (Signed)
Addended by: Merian Capron on: 12/31/2021 01:06 PM   Modules accepted: Orders

## 2021-12-31 NOTE — Progress Notes (Signed)
Pt informed that the ultrasound is considered a limited OB ultrasound and is not intended to be a complete ultrasound exam.  Patient also informed that the ultrasound is not being completed with the intent of assessing for fetal or placental anomalies or any pelvic abnormalities.  Explained that the purpose of todays ultrasound is to assess for presentation, BPP and amniotic fluid volume.  Patient acknowledges the purpose of the exam and the limitations of the study.    Pt requested Rx for a spacer to use with her Albuterol inhaler.  I advised pt that the Rx will be sent.

## 2022-01-06 ENCOUNTER — Encounter: Payer: Medicaid Other | Admitting: Family Medicine

## 2022-01-06 ENCOUNTER — Ambulatory Visit: Payer: Medicaid Other | Admitting: *Deleted

## 2022-01-06 ENCOUNTER — Other Ambulatory Visit: Payer: Self-pay

## 2022-01-06 ENCOUNTER — Other Ambulatory Visit: Payer: Medicaid Other

## 2022-01-06 ENCOUNTER — Ambulatory Visit (INDEPENDENT_AMBULATORY_CARE_PROVIDER_SITE_OTHER): Payer: Medicaid Other

## 2022-01-06 ENCOUNTER — Encounter: Payer: Self-pay | Admitting: Family Medicine

## 2022-01-06 ENCOUNTER — Ambulatory Visit (INDEPENDENT_AMBULATORY_CARE_PROVIDER_SITE_OTHER): Payer: Medicaid Other | Admitting: Family Medicine

## 2022-01-06 VITALS — BP 102/65 | HR 79 | Wt 140.2 lb

## 2022-01-06 DIAGNOSIS — Z3A34 34 weeks gestation of pregnancy: Secondary | ICD-10-CM

## 2022-01-06 DIAGNOSIS — O09523 Supervision of elderly multigravida, third trimester: Secondary | ICD-10-CM

## 2022-01-06 DIAGNOSIS — O0990 Supervision of high risk pregnancy, unspecified, unspecified trimester: Secondary | ICD-10-CM

## 2022-01-06 DIAGNOSIS — O24415 Gestational diabetes mellitus in pregnancy, controlled by oral hypoglycemic drugs: Secondary | ICD-10-CM | POA: Diagnosis not present

## 2022-01-06 DIAGNOSIS — O99013 Anemia complicating pregnancy, third trimester: Secondary | ICD-10-CM

## 2022-01-06 DIAGNOSIS — Z789 Other specified health status: Secondary | ICD-10-CM

## 2022-01-06 DIAGNOSIS — Z98891 History of uterine scar from previous surgery: Secondary | ICD-10-CM

## 2022-01-06 NOTE — Progress Notes (Signed)
PRENATAL VISIT NOTE  Subjective:  Rebecca Carlson is a 41 y.o. [id number] at [redacted]w[redacted]d being seen today for ongoing prenatal care.  She is currently monitored for the following issues for this high-risk pregnancy and has Female circumcision; Previous cesarean section--subsequent VBAC x 3; Supervision of high risk pregnancy, antepartum; AMA (advanced maternal age) multigravida 35+, third trimester; Anemia affecting pregnancy in third trimester; Gestational diabetes mellitus in pregnancy, controlled by oral hypoglycemic drugs; and Language barrier on their problem list.  Patient reports no complaints.  Contractions: Irregular. Vag. Bleeding: None.  Movement: Present. Denies leaking of fluid.   She is wondering if she can just take Metformin 500 mg once daily instead of twice daily for her sugars. She finds that taking it twice daily makes her tired and feel weak. She has brought her log of glucose values with taking it once per day and wants to make sure they are okay.   The following portions of the patient's history were reviewed and updated as appropriate: allergies, current medications, past family history, past medical history, past social history, past surgical history and problem list.   Objective:   Vitals:   01/06/22 1113  BP: 102/65  Pulse: 79  Weight: 140 lb 3.2 oz (63.6 kg)    Fetal Status: Fetal Heart Rate (bpm): NST Fundal Height: 34 cm Movement: Present  General:  Alert, oriented and cooperative. Patient is in no acute distress.  Skin: Skin is warm and dry. No rash noted.   Cardiovascular: Normal heart rate noted.  Respiratory: Normal respiratory effort, no problems with respiration noted.  Abdomen: Soft, gravid, appropriate for gestational age.       Pelvic: Cervical exam deferred.  Extremities: Normal range of motion.  Edema: None.  Mental Status: Normal mood and affect. Normal behavior. Normal judgment and thought content.   Assessment and Plan:  Pregnancy:  [id number] at [redacted]w[redacted]d  1. Supervision of high risk pregnancy, antepartum 2. [redacted] weeks gestation of pregnancy Progressing well. No concerns. BPP 10/10. FH appropriate. Follow up for next OB visit in 2 weeks. NST on 1/25.  3. Gestational diabetes mellitus in pregnancy, controlled by oral hypoglycemic drugs Reviewed glucose log with patient. Only a couple values barely above goal; majority within appropriate range. Will continue Metformin 500 mg once daily given that this is better tolerated by patient and her glucose values are appropriate on this regimen. Will closely monitor and reassess next visit.   4. Previous cesarean section--subsequent VBAC x 3 Planning for TOLAC. Consent signed 10/29/21.  5. AMA (advanced maternal age) multigravida 88+, third trimester LR NIPS, Horizon negative, AFP negative.   6. Anemia affecting pregnancy in third trimester Compliant with iron supplementation. Consider repeat at next visit.   7. Language barrier Arabic interpreter used via iPad [employer] interpreting [employer] [id number]).  Preterm labor symptoms and general obstetric precautions including but not limited to vaginal bleeding, contractions, leaking of fluid and fetal movement were reviewed in detail with the patient.  Please refer to After Visit Summary for other counseling recommendations.   Return in about 8 days (around 01/14/2022) for NST/BPP - scheduled,  2/1 HOB and NST/BPP as scheduled.  Future Appointments  Date Time Provider [place]  01/14/2022 10:15 AM [facility] NST [facility]  01/21/2022  9:55 AM Woodroe Mode, MD [facility]  01/21/2022 11:15 AM [facility] NURSE [facility]  01/21/2022 11:30 AM [facility] US3 [facility]  01/28/2022  9:15 AM [facility] NST [facility]  01/28/2022 10:55 AM Radene Gunning, MD  [facility]  02/04/2022 10:15 AM [facility] NST [facility]  02/11/2022 10:15 AM [facility] NST [facility]   Genia Del, MD

## 2022-01-06 NOTE — Progress Notes (Signed)

## 2022-01-14 ENCOUNTER — Ambulatory Visit (INDEPENDENT_AMBULATORY_CARE_PROVIDER_SITE_OTHER): Payer: Medicaid Other | Admitting: General Practice

## 2022-01-14 ENCOUNTER — Other Ambulatory Visit: Payer: Self-pay

## 2022-01-14 ENCOUNTER — Ambulatory Visit (INDEPENDENT_AMBULATORY_CARE_PROVIDER_SITE_OTHER): Payer: Medicaid Other

## 2022-01-14 VITALS — BP 115/80 | HR 96

## 2022-01-14 DIAGNOSIS — O24415 Gestational diabetes mellitus in pregnancy, controlled by oral hypoglycemic drugs: Secondary | ICD-10-CM

## 2022-01-14 DIAGNOSIS — Z3A35 35 weeks gestation of pregnancy: Secondary | ICD-10-CM | POA: Diagnosis not present

## 2022-01-14 DIAGNOSIS — O0990 Supervision of high risk pregnancy, unspecified, unspecified trimester: Secondary | ICD-10-CM

## 2022-01-14 DIAGNOSIS — O09523 Supervision of elderly multigravida, third trimester: Secondary | ICD-10-CM | POA: Diagnosis not present

## 2022-01-14 NOTE — Progress Notes (Signed)
Pt informed that the ultrasound is considered a limited OB ultrasound and is not intended to be a complete ultrasound exam.  Patient also informed that the ultrasound is not being completed with the intent of assessing for fetal or placental anomalies or any pelvic abnormalities.  Explained that the purpose of today’s ultrasound is to assess for  BPP, presentation, and AFI.  Patient acknowledges the purpose of the exam and the limitations of the study.    ° °Rebecca Carlson H RN BSN °01/14/22 ° °

## 2022-01-21 ENCOUNTER — Other Ambulatory Visit: Payer: Medicaid Other

## 2022-01-21 ENCOUNTER — Other Ambulatory Visit: Payer: Self-pay

## 2022-01-21 ENCOUNTER — Encounter: Payer: Self-pay | Admitting: *Deleted

## 2022-01-21 ENCOUNTER — Ambulatory Visit: Payer: Medicaid Other | Attending: Obstetrics

## 2022-01-21 ENCOUNTER — Ambulatory Visit: Payer: Medicaid Other | Admitting: *Deleted

## 2022-01-21 ENCOUNTER — Other Ambulatory Visit (HOSPITAL_COMMUNITY)
Admission: RE | Admit: 2022-01-21 | Discharge: 2022-01-21 | Disposition: A | Discharge status: Home / Self Care | Payer: Medicaid Other | Source: Ambulatory Visit | Attending: Obstetrics & Gynecology | Admitting: Obstetrics & Gynecology

## 2022-01-21 ENCOUNTER — Ambulatory Visit (INDEPENDENT_AMBULATORY_CARE_PROVIDER_SITE_OTHER): Payer: Medicaid Other | Admitting: Nurse Practitioner

## 2022-01-21 VITALS — BP 106/86 | HR 91 | Wt 139.9 lb

## 2022-01-21 VITALS — BP 99/59 | HR 86

## 2022-01-21 DIAGNOSIS — Z3A36 36 weeks gestation of pregnancy: Secondary | ICD-10-CM | POA: Diagnosis not present

## 2022-01-21 DIAGNOSIS — O24415 Gestational diabetes mellitus in pregnancy, controlled by oral hypoglycemic drugs: Secondary | ICD-10-CM

## 2022-01-21 DIAGNOSIS — O09523 Supervision of elderly multigravida, third trimester: Secondary | ICD-10-CM | POA: Insufficient documentation

## 2022-01-21 DIAGNOSIS — O0990 Supervision of high risk pregnancy, unspecified, unspecified trimester: Secondary | ICD-10-CM

## 2022-01-21 NOTE — Progress Notes (Signed)
° ° °  Subjective:  Rebecca Carlson is a 41 y.o. G6P5005 at [redacted]w[redacted]d being seen today for ongoing prenatal care.  She is currently monitored for the following issues for this high-risk pregnancy and has Female circumcision; Previous cesarean section--subsequent VBAC x 3; Supervision of high risk pregnancy, antepartum; AMA (advanced maternal age) multigravida 35+, third trimester; Anemia affecting pregnancy in third trimester; Gestational diabetes mellitus in pregnancy, controlled by oral hypoglycemic drugs; and Language barrier on their problem list.  Patient reports no complaints.  Contractions: Irritability. Vag. Bleeding: None.  Movement: Present. Denies leaking of fluid.   The following portions of the patient's history were reviewed and updated as appropriate: allergies, current medications, past family history, past medical history, past social history, past surgical history and problem list. Problem list updated.  Objective:   Vitals:   01/21/22 1019  BP: 106/86  Pulse: 91  Weight: 139 lb 14.4 oz (63.5 kg)    Fetal Status: Fetal Heart Rate (bpm): 133   Movement: Present     General:  Alert, oriented and cooperative. Patient is in no acute distress.  Skin: Skin is warm and dry. No rash noted.   Cardiovascular: Normal heart rate noted  Respiratory: Normal respiratory effort, no problems with respiration noted  Abdomen: Soft, gravid, appropriate for gestational age. Pain/Pressure: Present     Pelvic:  Cervical exam deferred       Vaginal swabs done  Extremities: Normal range of motion.  Edema: None  Mental Status: Normal mood and affect. Normal behavior. Normal judgment and thought content.   Urinalysis:      Assessment and Plan:  Pregnancy: G6P5005 at [redacted]w[redacted]d  1. Supervision of high risk pregnancy, antepartum In person interpreter accompanied her with the entire visit today. Signed for VBAC but now with gestational diabetes wonders if she will need a C/S. Advised that as of  today, vaginal birth is the likely option but things could change as she gets closer to birth and to keep asking the questions to her provider.  - GC/Chlamydia probe amp ([place])not at [facility] - Culture, beta strep (group b only)  2. Gestational diabetes mellitus in pregnancy, controlled by oral hypoglycemic drugs Log of glucose readings reviewed - Noted: 130 x 3 times after dinner - very sporatic, all others are in normal range Reviewed differences in what she eats at dinner - more rice. Recommended less rice and to walk for 15 minutes after lunch to help decrease 2 hr glucose reading after dinner. To bring record back to all appointments  3. AMA (advanced maternal age) multigravida 38+, third trimester Antenatal testing weekly  - has BPP today at [facility]  4. [redacted] weeks gestation of pregnancy   Preterm labor symptoms and general obstetric precautions including but not limited to vaginal bleeding, contractions, leaking of fluid and fetal movement were reviewed in detail with the patient. Please refer to After Visit Summary for other counseling recommendations.  Return in about 1 week (around 01/28/2022) for appointment already scheduled.  Earlie Server, RN, MSN, NP-BC Nurse Practitioner, [facility] for [employer], [place] [facility] 01/21/2022 10:51 AM

## 2022-01-22 LAB — GC/CHLAMYDIA PROBE AMP (~~LOC~~) NOT AT ARMC
Chlamydia: NEGATIVE
Comment: NEGATIVE
Comment: NORMAL
Neisseria Gonorrhea: NEGATIVE

## 2022-01-24 NOTE — Progress Notes (Deleted)
° °  PRENATAL VISIT NOTE  Subjective:  Rebecca Carlson is a 41 y.o. [id number] at 37w***d being seen today for ongoing prenatal care.  She is currently monitored for the following issues for this {Blank single:19197::"high-risk","low-risk"} pregnancy and has Female circumcision; Previous cesarean section--subsequent VBAC x 3; Supervision of high risk pregnancy, antepartum; AMA (advanced maternal age) multigravida 35+, third trimester; Anemia affecting pregnancy in third trimester; Gestational diabetes mellitus in pregnancy, controlled by oral hypoglycemic drugs; and Language barrier on their problem list.  Patient reports {[id number]}.   .  .   . Denies leaking of fluid.   The following portions of the patient's history were reviewed and updated as appropriate: allergies, current medications, past family history, past medical history, past social history, past surgical history and problem list.   Objective:  There were no vitals filed for this visit.  Fetal Status:           General:  Alert, oriented and cooperative. Patient is in no acute distress.  Skin: Skin is warm and dry. No rash noted.   Cardiovascular: Normal heart rate noted  Respiratory: Normal respiratory effort, no problems with respiration noted  Abdomen: Soft, gravid, appropriate for gestational age.        Pelvic: {Blank single:19197::"Cervical exam performed in the presence of a chaperone","Cervical exam deferred"}        Extremities: Normal range of motion.     Mental Status: Normal mood and affect. Normal behavior. Normal judgment and thought content.   Assessment and Plan:  Pregnancy: [id number] at 37w***d 1. Gestational diabetes mellitus in pregnancy, controlled by oral hypoglycemic drugs - Reviewed CBG log - *** - Metformin 500 bid for current regimen - Continue NSTs and BPP - Growth on 2/1 was 61%ile, AC=37, HC=36  2. Previous cesarean section--subsequent VBAC x 4 - She has had one c-section with four subsequent  vbacs - Reviewed GDM does not need to be indication for c-section, she is still a very good candidate for TOLAC given her prior history of success. We did discussed that we would recommend induction in 39th week and slightly increased risk of uterine rupture although given her history, we discussed that I think she is low risk for uterine rupture though.   3. Language barrier - [place] speaking  4. Anemia affecting pregnancy in third trimester - Hgb improved from 10.3 to 10.6 over the course of PO iron.  - Recheck today ***  5. AMA (advanced maternal age) multigravida 4+, third trimester  6. Supervision of high risk pregnancy, antepartum - GBS ***   {Blank single:19197::"Term","Preterm"} labor symptoms and general obstetric precautions including but not limited to vaginal bleeding, contractions, leaking of fluid and fetal movement were reviewed in detail with the patient. Please refer to After Visit Summary for other counseling recommendations.   No follow-ups on file.  Future Appointments  Date Time Provider [place]  01/28/2022  9:15 AM [facility] NST [facility]  01/28/2022 10:55 AM Radene Gunning, MD [facility]  02/04/2022 10:15 AM [facility] NST [facility]  02/11/2022 10:15 AM [facility] NST [facility]    Radene Gunning, MD

## 2022-01-25 LAB — CULTURE, BETA STREP (GROUP B ONLY): Strep Gp B Culture: NEGATIVE

## 2022-01-27 ENCOUNTER — Inpatient Hospital Stay (HOSPITAL_COMMUNITY)
Admission: AD | Admit: 2022-01-27 | Discharge: 2022-01-29 | DRG: 807 | Disposition: A | Discharge status: Home / Self Care | Payer: Medicaid Other | Attending: Obstetrics and Gynecology | Admitting: Obstetrics and Gynecology

## 2022-01-27 ENCOUNTER — Other Ambulatory Visit: Payer: Self-pay

## 2022-01-27 ENCOUNTER — Encounter (HOSPITAL_COMMUNITY): Payer: Self-pay | Admitting: Obstetrics and Gynecology

## 2022-01-27 DIAGNOSIS — Z8632 Personal history of gestational diabetes: Secondary | ICD-10-CM | POA: Diagnosis present

## 2022-01-27 DIAGNOSIS — Z30017 Encounter for initial prescription of implantable subdermal contraceptive: Secondary | ICD-10-CM

## 2022-01-27 DIAGNOSIS — O0990 Supervision of high risk pregnancy, unspecified, unspecified trimester: Secondary | ICD-10-CM

## 2022-01-27 DIAGNOSIS — O24425 Gestational diabetes mellitus in childbirth, controlled by oral hypoglycemic drugs: Principal | ICD-10-CM | POA: Diagnosis present

## 2022-01-27 DIAGNOSIS — D509 Iron deficiency anemia, unspecified: Secondary | ICD-10-CM | POA: Diagnosis present

## 2022-01-27 DIAGNOSIS — Z98891 History of uterine scar from previous surgery: Secondary | ICD-10-CM

## 2022-01-27 DIAGNOSIS — Z20822 Contact with and (suspected) exposure to covid-19: Secondary | ICD-10-CM | POA: Diagnosis present

## 2022-01-27 DIAGNOSIS — O99013 Anemia complicating pregnancy, third trimester: Secondary | ICD-10-CM | POA: Diagnosis present

## 2022-01-27 DIAGNOSIS — O3483 Maternal care for other abnormalities of pelvic organs, third trimester: Secondary | ICD-10-CM | POA: Diagnosis present

## 2022-01-27 DIAGNOSIS — Z3A37 37 weeks gestation of pregnancy: Secondary | ICD-10-CM

## 2022-01-27 DIAGNOSIS — O24415 Gestational diabetes mellitus in pregnancy, controlled by oral hypoglycemic drugs: Secondary | ICD-10-CM | POA: Diagnosis present

## 2022-01-27 DIAGNOSIS — O09523 Supervision of elderly multigravida, third trimester: Secondary | ICD-10-CM | POA: Diagnosis present

## 2022-01-27 DIAGNOSIS — O34219 Maternal care for unspecified type scar from previous cesarean delivery: Secondary | ICD-10-CM | POA: Diagnosis present

## 2022-01-27 DIAGNOSIS — N90810 Female genital mutilation status, unspecified: Secondary | ICD-10-CM | POA: Diagnosis present

## 2022-01-27 DIAGNOSIS — Z603 Acculturation difficulty: Secondary | ICD-10-CM | POA: Diagnosis present

## 2022-01-27 DIAGNOSIS — O9902 Anemia complicating childbirth: Secondary | ICD-10-CM | POA: Diagnosis present

## 2022-01-27 DIAGNOSIS — Z789 Other specified health status: Secondary | ICD-10-CM | POA: Diagnosis present

## 2022-01-27 HISTORY — DX: Gestational diabetes mellitus in pregnancy, unspecified control: O24.419

## 2022-01-27 NOTE — MAU Note (Signed)
Ctx since 2100. No VB or LOF. Good FM. Does not know last sve.

## 2022-01-28 ENCOUNTER — Other Ambulatory Visit: Payer: Self-pay

## 2022-01-28 ENCOUNTER — Encounter: Payer: Medicaid Other | Admitting: General Practice

## 2022-01-28 ENCOUNTER — Inpatient Hospital Stay (HOSPITAL_COMMUNITY): Payer: Medicaid Other | Admitting: Anesthesiology

## 2022-01-28 ENCOUNTER — Encounter (HOSPITAL_COMMUNITY): Payer: Self-pay | Admitting: Family Medicine

## 2022-01-28 ENCOUNTER — Encounter: Payer: Medicaid Other | Admitting: Obstetrics and Gynecology

## 2022-01-28 DIAGNOSIS — Z20822 Contact with and (suspected) exposure to covid-19: Secondary | ICD-10-CM | POA: Diagnosis present

## 2022-01-28 DIAGNOSIS — O9902 Anemia complicating childbirth: Secondary | ICD-10-CM | POA: Diagnosis present

## 2022-01-28 DIAGNOSIS — Z30017 Encounter for initial prescription of implantable subdermal contraceptive: Secondary | ICD-10-CM | POA: Diagnosis not present

## 2022-01-28 DIAGNOSIS — O3483 Maternal care for other abnormalities of pelvic organs, third trimester: Secondary | ICD-10-CM | POA: Diagnosis present

## 2022-01-28 DIAGNOSIS — O24424 Gestational diabetes mellitus in childbirth, insulin controlled: Secondary | ICD-10-CM | POA: Diagnosis not present

## 2022-01-28 DIAGNOSIS — O24415 Gestational diabetes mellitus in pregnancy, controlled by oral hypoglycemic drugs: Secondary | ICD-10-CM

## 2022-01-28 DIAGNOSIS — Z98891 History of uterine scar from previous surgery: Secondary | ICD-10-CM

## 2022-01-28 DIAGNOSIS — O09523 Supervision of elderly multigravida, third trimester: Secondary | ICD-10-CM

## 2022-01-28 DIAGNOSIS — O24425 Gestational diabetes mellitus in childbirth, controlled by oral hypoglycemic drugs: Secondary | ICD-10-CM | POA: Diagnosis present

## 2022-01-28 DIAGNOSIS — O0990 Supervision of high risk pregnancy, unspecified, unspecified trimester: Secondary | ICD-10-CM

## 2022-01-28 DIAGNOSIS — Z789 Other specified health status: Secondary | ICD-10-CM

## 2022-01-28 DIAGNOSIS — O34211 Maternal care for low transverse scar from previous cesarean delivery: Secondary | ICD-10-CM | POA: Diagnosis not present

## 2022-01-28 DIAGNOSIS — O3463 Maternal care for abnormality of vagina, third trimester: Secondary | ICD-10-CM | POA: Diagnosis not present

## 2022-01-28 DIAGNOSIS — N90810 Female genital mutilation status, unspecified: Secondary | ICD-10-CM | POA: Diagnosis present

## 2022-01-28 DIAGNOSIS — O34219 Maternal care for unspecified type scar from previous cesarean delivery: Secondary | ICD-10-CM | POA: Diagnosis present

## 2022-01-28 DIAGNOSIS — Z3A37 37 weeks gestation of pregnancy: Secondary | ICD-10-CM | POA: Diagnosis not present

## 2022-01-28 DIAGNOSIS — O26893 Other specified pregnancy related conditions, third trimester: Secondary | ICD-10-CM | POA: Diagnosis present

## 2022-01-28 DIAGNOSIS — O99013 Anemia complicating pregnancy, third trimester: Secondary | ICD-10-CM

## 2022-01-28 LAB — TYPE AND SCREEN
ABO/RH(D): A POS
Antibody Screen: NEGATIVE

## 2022-01-28 LAB — CBC
HCT: 34.2 % — ABNORMAL LOW (ref 36.0–46.0)
Hemoglobin: 11.5 g/dL — ABNORMAL LOW (ref 12.0–15.0)
MCH: 29.5 pg (ref 26.0–34.0)
MCHC: 33.6 g/dL (ref 30.0–36.0)
MCV: 87.7 fL (ref 80.0–100.0)
Platelets: 220 10*3/uL (ref 150–400)
RBC: 3.9 MIL/uL (ref 3.87–5.11)
RDW: 14 % (ref 11.5–15.5)
WBC: 12.1 10*3/uL — ABNORMAL HIGH (ref 4.0–10.5)
nRBC: 0 % (ref 0.0–0.2)

## 2022-01-28 LAB — GLUCOSE, CAPILLARY
Glucose-Capillary: 150 mg/dL — ABNORMAL HIGH (ref 70–99)
Glucose-Capillary: 137 mg/dL — ABNORMAL HIGH (ref 70–99)
Glucose-Capillary: 121 mg/dL — ABNORMAL HIGH (ref 70–99)
Glucose-Capillary: 121 mg/dL — ABNORMAL HIGH (ref 70–99)

## 2022-01-28 LAB — RESP PANEL BY RT-PCR (FLU A&B, COVID) ARPGX2
Influenza A by PCR: NEGATIVE
Influenza B by PCR: NEGATIVE
SARS Coronavirus 2 by RT PCR: NEGATIVE

## 2022-01-28 LAB — RPR: RPR Ser Ql: NONREACTIVE

## 2022-01-28 MED ORDER — ACETAMINOPHEN 325 MG PO TABS
650.0000 mg | ORAL_TABLET | ORAL | Status: DC | PRN
  Administered 2022-01-28: 650 mg via ORAL
  Filled 2022-01-28: qty 2

## 2022-01-28 MED ORDER — BENZOCAINE-MENTHOL 20-0.5 % EX AERO
1.0000 "application " | INHALATION_SPRAY | CUTANEOUS | Status: DC | PRN
  Administered 2022-01-28: 1 via TOPICAL
  Filled 2022-01-28: qty 56

## 2022-01-28 MED ORDER — ONDANSETRON HCL 4 MG PO TABS: 4.0000 mg | ORAL_TABLET | ORAL | Status: DC | PRN

## 2022-01-28 MED ORDER — OXYTOCIN BOLUS FROM INFUSION
333.0000 mL | Freq: Once | INTRAVENOUS | Status: AC
  Administered 2022-01-28: 333 mL via INTRAVENOUS

## 2022-01-28 MED ORDER — LACTATED RINGERS IV SOLN: 500.0000 mL | INTRAVENOUS | Status: DC | PRN

## 2022-01-28 MED ORDER — OXYCODONE-ACETAMINOPHEN 5-325 MG PO TABS: 1.0000 | ORAL_TABLET | ORAL | Status: DC | PRN

## 2022-01-28 MED ORDER — METFORMIN HCL 500 MG PO TABS
500.0000 mg | ORAL_TABLET | Freq: Two times a day (BID) | ORAL | Status: DC
  Administered 2022-01-28 – 2022-01-29 (×2): 500 mg via ORAL
  Filled 2022-01-28 (×3): qty 1

## 2022-01-28 MED ORDER — OXYTOCIN-SODIUM CHLORIDE 30-0.9 UT/500ML-% IV SOLN
2.5000 [IU]/h | INTRAVENOUS | Status: DC
  Filled 2022-01-28: qty 500

## 2022-01-28 MED ORDER — TRANEXAMIC ACID-NACL 1000-0.7 MG/100ML-% IV SOLN
INTRAVENOUS | Status: AC
  Administered 2022-01-28: 1000 mg
  Filled 2022-01-28: qty 100

## 2022-01-28 MED ORDER — COCONUT OIL OIL: 1.0000 "application " | TOPICAL_OIL | Status: DC | PRN

## 2022-01-28 MED ORDER — DIPHENHYDRAMINE HCL 25 MG PO CAPS: 25.0000 mg | ORAL_CAPSULE | Freq: Four times a day (QID) | ORAL | Status: DC | PRN

## 2022-01-28 MED ORDER — TRANEXAMIC ACID-NACL 1000-0.7 MG/100ML-% IV SOLN: 1000.0000 mg | INTRAVENOUS | Status: DC

## 2022-01-28 MED ORDER — ONDANSETRON HCL 4 MG/2ML IJ SOLN: 4.0000 mg | INTRAMUSCULAR | Status: DC | PRN

## 2022-01-28 MED ORDER — FENTANYL-BUPIVACAINE-NACL 0.5-0.125-0.9 MG/250ML-% EP SOLN
EPIDURAL | Status: AC
  Filled 2022-01-28: qty 250

## 2022-01-28 MED ORDER — WITCH HAZEL-GLYCERIN EX PADS: 1.0000 "application " | MEDICATED_PAD | CUTANEOUS | Status: DC | PRN

## 2022-01-28 MED ORDER — IBUPROFEN 600 MG PO TABS
600.0000 mg | ORAL_TABLET | Freq: Four times a day (QID) | ORAL | Status: DC
  Administered 2022-01-28 – 2022-01-29 (×5): 600 mg via ORAL
  Filled 2022-01-28 (×5): qty 1

## 2022-01-28 MED ORDER — FENTANYL-BUPIVACAINE-NACL 0.5-0.125-0.9 MG/250ML-% EP SOLN
EPIDURAL | Status: DC | PRN
  Administered 2022-01-28: 12 mL/h via EPIDURAL

## 2022-01-28 MED ORDER — FENTANYL CITRATE (PF) 100 MCG/2ML IJ SOLN: 50.0000 ug | INTRAMUSCULAR | Status: DC | PRN

## 2022-01-28 MED ORDER — LIDOCAINE HCL (PF) 1 % IJ SOLN
INTRAMUSCULAR | Status: DC | PRN
  Administered 2022-01-28: 5 mL via EPIDURAL

## 2022-01-28 MED ORDER — MEDROXYPROGESTERONE ACETATE 150 MG/ML IM SUSP: 150.0000 mg | INTRAMUSCULAR | Status: DC | PRN

## 2022-01-28 MED ORDER — SIMETHICONE 80 MG PO CHEW: 80.0000 mg | CHEWABLE_TABLET | ORAL | Status: DC | PRN

## 2022-01-28 MED ORDER — DIBUCAINE (PERIANAL) 1 % EX OINT: 1.0000 "application " | TOPICAL_OINTMENT | CUTANEOUS | Status: DC | PRN

## 2022-01-28 MED ORDER — MEASLES, MUMPS & RUBELLA VAC IJ SOLR: 0.5000 mL | Freq: Once | INTRAMUSCULAR | Status: DC

## 2022-01-28 MED ORDER — SOD CITRATE-CITRIC ACID 500-334 MG/5ML PO SOLN: 30.0000 mL | ORAL | Status: DC | PRN

## 2022-01-28 MED ORDER — SENNOSIDES-DOCUSATE SODIUM 8.6-50 MG PO TABS
2.0000 | ORAL_TABLET | Freq: Every day | ORAL | Status: DC
  Administered 2022-01-29: 2 via ORAL
  Filled 2022-01-28: qty 2

## 2022-01-28 MED ORDER — PRENATAL MULTIVITAMIN CH
1.0000 | ORAL_TABLET | Freq: Every day | ORAL | Status: DC
  Administered 2022-01-28 – 2022-01-29 (×2): 1 via ORAL
  Filled 2022-01-28 (×2): qty 1

## 2022-01-28 MED ORDER — ONDANSETRON HCL 4 MG/2ML IJ SOLN
4.0000 mg | Freq: Four times a day (QID) | INTRAMUSCULAR | Status: DC | PRN
  Administered 2022-01-28 (×2): 4 mg via INTRAVENOUS
  Filled 2022-01-28 (×2): qty 2

## 2022-01-28 MED ORDER — OXYCODONE-ACETAMINOPHEN 5-325 MG PO TABS: 2.0000 | ORAL_TABLET | ORAL | Status: DC | PRN

## 2022-01-28 MED ORDER — EPHEDRINE 5 MG/ML INJ: 10.0000 mg | INTRAVENOUS | Status: DC | PRN

## 2022-01-28 MED ORDER — PHENYLEPHRINE 40 MCG/ML (10ML) SYRINGE FOR IV PUSH (FOR BLOOD PRESSURE SUPPORT): 80.0000 ug | PREFILLED_SYRINGE | INTRAVENOUS | Status: DC | PRN

## 2022-01-28 MED ORDER — TETANUS-DIPHTH-ACELL PERTUSSIS 5-2.5-18.5 LF-MCG/0.5 IM SUSY: 0.5000 mL | PREFILLED_SYRINGE | Freq: Once | INTRAMUSCULAR | Status: DC

## 2022-01-28 MED ORDER — LACTATED RINGERS IV SOLN
500.0000 mL | Freq: Once | INTRAVENOUS | Status: AC
  Administered 2022-01-28: 500 mL via INTRAVENOUS

## 2022-01-28 MED ORDER — DIPHENHYDRAMINE HCL 50 MG/ML IJ SOLN: 12.5000 mg | INTRAMUSCULAR | Status: DC | PRN

## 2022-01-28 MED ORDER — LACTATED RINGERS IV SOLN: INTRAVENOUS | Status: DC

## 2022-01-28 MED ORDER — FENTANYL-BUPIVACAINE-NACL 0.5-0.125-0.9 MG/250ML-% EP SOLN: 12.0000 mL/h | EPIDURAL | Status: DC | PRN

## 2022-01-28 MED ORDER — ACETAMINOPHEN 325 MG PO TABS: 650.0000 mg | ORAL_TABLET | ORAL | Status: DC | PRN

## 2022-01-28 MED ORDER — LIDOCAINE HCL (PF) 1 % IJ SOLN: 30.0000 mL | INTRAMUSCULAR | Status: DC | PRN

## 2022-01-28 NOTE — Anesthesia Preprocedure Evaluation (Addendum)
Anesthesia Evaluation  Patient identified by MRN, date of birth, ID band Patient awake    Reviewed: Patient's Chart, lab work & pertinent test results  Airway Mallampati: II  TM Distance: >3 FB Neck ROM: Full    Dental no notable dental hx. (+) Teeth Intact, Dental Advisory Given   Pulmonary neg pulmonary ROS,    Pulmonary exam normal breath sounds clear to auscultation       Cardiovascular Exercise Tolerance: Good Normal cardiovascular exam Rhythm:Regular Rate:Normal     Neuro/Psych negative neurological ROS  negative psych ROS   GI/Hepatic negative GI ROS, Neg liver ROS,   Endo/Other  diabetes, Gestational  Renal/GU negative Renal ROS     Musculoskeletal   Abdominal   Peds  Hematology Lab Results      Component                Value               Date                      HGB                      11.5 (L)            01/28/2022                HCT                      34.2 (L)            01/28/2022                       PLT                      220                 01/28/2022              Anesthesia Other Findings   Reproductive/Obstetrics (+) Pregnancy                             Anesthesia Physical Anesthesia Plan  ASA: 2  Anesthesia Plan: Epidural   Post-op Pain Management:    Induction:   PONV Risk Score and Plan:   Airway Management Planned:   Additional Equipment:   Intra-op Plan:   Post-operative Plan:   Informed Consent: I have reviewed the patients History and Physical, chart, labs and discussed the procedure including the risks, benefits and alternatives for the proposed anesthesia with the patient or authorized representative who has indicated his/her understanding and acceptance.     Interpreter used for [employer] Discussed with:   Anesthesia Plan Comments: (37.6 Wk G6P5 w gDM fo TOLAC w LEA)       Anesthesia Quick Evaluation

## 2022-01-28 NOTE — Anesthesia Procedure Notes (Signed)
Epidural Patient location during procedure: OB Start time: 01/28/2022 1:14 AM End time: 01/28/2022 1:29 AM  Staffing Anesthesiologist: Trevor Iha, MD Performed: anesthesiologist   Preanesthetic Checklist Completed: patient identified, IV checked, site marked, risks and benefits discussed, surgical consent, monitors and equipment checked, pre-op evaluation and timeout performed  Epidural Patient position: sitting Prep: DuraPrep and site prepped and draped Patient monitoring: continuous pulse ox and blood pressure Approach: midline Location: L3-L4 Injection technique: LOR air  Needle:  Needle type: Tuohy  Needle gauge: 17 G Needle length: 9 cm and 9 Needle insertion depth: 7 cm Catheter type: closed end flexible Catheter size: 19 Gauge Catheter at skin depth: 12 cm Test dose: negative  Assessment Events: blood not aspirated, injection not painful, no injection resistance, no paresthesia and negative IV test  Additional Notes Patient identified. Risks/Benefits/Options discussed with patient including but not limited to bleeding, infection, nerve damage, paralysis, failed block, incomplete pain control, headache, blood pressure changes, nausea, vomiting, reactions to medication both or allergic, itching and postpartum back pain. Confirmed with bedside nurse the patient's most recent platelet count. Confirmed with patient that they are not currently taking any anticoagulation, have any bleeding history or any family history of bleeding disorders. Patient expressed understanding and wished to proceed. All questions were answered. Sterile technique was used throughout the entire procedure. Please see nursing notes for vital signs. Test dose was given through epidural needle and negative prior to continuing to dose epidural or start infusion. Warning signs of high block given to the patient including shortness of breath, tingling/numbness in hands, complete motor block, or any concerning  symptoms with instructions to call for help. Patient was given instructions on fall risk and not to get out of bed. All questions and concerns addressed with instructions to call with any issues. 1 Attempt (S) . Patient tolerated procedure well.

## 2022-01-28 NOTE — H&P (Addendum)
OB ADMISSION/ HISTORY & PHYSICAL:  Admission Date: 01/27/2022 10:37 PM  Admit Diagnosis: CTX 10 mins    Rebecca Carlson is a 41 y.o. female at 87w6dpresenting for spontaneous labor. Patient reports positive fetal movement, no vaginal bleeding or LOF. Patient denies HA, vision changes, and RUQ pain. EFW 3109g (61%). Anticipating arrival of baby girl. Electronic interpretor utilized due to language barrier.   Prenatal History: [id number]  EDC : 02/12/2022, by Last Menstrual Period  [facility] Pren[facility] course complicated by: -[id number] on Metformin -Previous Cesarean Section, VBAC x 4 -AMA (advanced maternal age) -Language barrier- Arabic -Female circumcision    Prenatal Labs:      NGlass blower/designerLocation [facility] Dating  LMP  Language  Arabic Anatomy [place] Normal  Flu Vaccine  09/29/21 Genetic/Carrier Screen  NIPS: low risk female AFP: negative   Horizon: negative  TDaP Vaccine  11/20/21 Hgb A1C or  GTT Early  Third trimester All values abnormal>GDM  COVID Vaccine No   LAB RESULTS   Rhogam  N/A Blood Type A/Positive/-- (10/10 1624)   Baby Feeding Plan Breast Antibody Negative (10/10 1624)  Contraception Nexplanon Rubella 3.34 (10/10 1624)  Circumcision N/A RPR Non Reactive (10/10 1624)   Pediatrician  [facility] HBsAg Negative (10/10 1624)   Support Person Abdalseed(FOB) HCVAb  Neg  Prenatal Classes N/A HIV Non Reactive (10/10 1624)     BTL Consent na GBS   (For PCN allergy, check sensitivities)   VBAC Consent 10/29/2010 Pap 04/04/2020 Normal           DME Rx [x ] BP cuff [ x] Weight Scale Waterbirth  [ ] Class [ ] Consent [ ] CNM visit  PHQ9 & GAD7 [  x] new OB [x  ] 28 weeks  [  ] 353weeks Induction  [ ] Orders Entered [ ]Foley Y/N      Maternal Diabetes: Yes:  Diabetes Type:  Insulin/Medication controlled: on Metformin  Genetic Screening: Normal Maternal Ultrasounds/Referrals: Normal Fetal Ultrasounds or other Referrals:   None Maternal Substance Abuse:  No Significant Maternal Medications:  None Significant Maternal Lab Results:  Group B Strep negative Other Comments:  None  Medical / Surgical History : Past medical history:  Past Medical History:  Diagnosis Date   Gestational diabetes    Medical history non-contributory    Previous cesarean section--subsequent VBAC x 3 12/03/2015   TOLAC consent signed 10/29/21    Past surgical history:  Past Surgical History:  Procedure Laterality Date   CESAREAN SECTION     CESAREAN SECTION      Family History:  Family History  Problem Relation Age of Onset   Diabetes Sister    Hypertension Sister     Social History:  reports that she has never smoked. She has never used smokeless tobacco. She reports that she does not drink alcohol and does not use drugs. Allergies: Patient has no known allergies.   Current Medications at time of admission:  Medications Prior to Admission  Medication Sig Dispense Refill Last Dose   albuterol (VENTOLIN HFA) 108 (90 Base) MCG/ACT inhaler Inhale 2 puffs into the lungs every 6 (six) hours as needed for wheezing or shortness of breath. 8 g 2 01/27/2022   ferrous sulfate (FERROUSUL) 325 (65 FE) MG tablet Take 1 tablet (325 mg total) by mouth every other day. 30 tablet 3 01/27/2022   metFORMIN (GLUCOPHAGE) 500 MG tablet Take 500 mg by mouth 2 (two)  times daily with a meal.   01/27/2022   prenatal vitamin w/FE, FA (PRENATAL 1 + 1) 27-1 MG TABS tablet Take 1 tablet by mouth daily at 12 noon. 30 tablet 11 01/27/2022   Accu-Chek Softclix Lancets lancets Use as instructed; check blood glucose 4 times daily 100 each 6    Blood Glucose Monitoring Suppl (ACCU-CHEK GUIDE ME) w/Device KIT USE TO CHECK BLOOD GLUCOSE FOUR TIMES DAILY.      glucose blood (ACCU-CHEK GUIDE) test strip Use as instructed; check blood glucose 4 times daily. 100 each 6    Spacer/Aero-Holding Chambers DEVI Use device as needed with inhalers 1 each 0      Review of  Systems: Review of Systems  Constitutional: Negative.   HENT: Negative.    Eyes: Negative.   Respiratory: Negative.    Neurological: Negative.   Psychiatric/Behavioral: Negative.     Physical Exam: Vital signs and nursing notes reviewed.  Patient Vitals for the past 24 hrs:  BP Temp Pulse Resp SpO2 Height Weight  01/27/22 2314 109/84 -- (!) 108 18 100 % -- --  01/27/22 2251 124/69 -- -- -- -- -- --  01/27/22 2250 -- 98.2 F (36.8 C) 92 16 100 % 5' (1.524 m) 62.1 kg     General: AAO x 3, NAD Heart: RRR Lungs:CTAB Abdomen: Gravid, NT Extremities: no edema Genitalia / VE: Dilation: 4 Effacement (%): 70 Cervical Position: Middle Station: -2 Presentation: Vertex Exam by:: Youlanda Roys, RN   FHR: 125 BPM, moderate variability, present accels, absent decels TOCO: Contractions Q 2-5 minutes   Labs:   Pending T&S, CBC, RPR  Recent Labs    01/28/22 0043  WBC 12.1*  HGB 11.5*  HCT 34.2*  PLT 220    Assessment:  41 y.o. [id number] at 9w6dhere for spontaneous labor.   1. 1st stage of labor 2. FHR category 1 3. GBS negative 4. Desires epidural 5. Plans to breastfeed 6.Previous Cesarean Section, VBAC x 4 [facility]. [facility] 8. A2GDM- on Metformin  Plan:  1. Admit to BS 2. Routine L&D orders 3. Analgesia/anesthesia PRN  4. Expectant management 5. CBG's Q4hr  6. Anticipate NSVB 7. Planning Nexplanon postpartum   ATrenton Gammon2/07/2022, 12:33 AM   I spoke with and examined patient and agree with resident/PA-S/MS/SNM's note and plan of care.  G6P5005 37w6demale in with early labor. Changed from 3/70/-3 to 4/70/-2 in MAU. Good fm, denies vb/lof. Pregnancy complicated A2B2IOn metformin 5007mID, EFW 61% at 36wks. Prev c/s for breech, then VBAC x 4. Wants TOLAC. Has 3a female circ, last delivery had to be cut then pt/husband wanted repaired. Desires the same this time.  VSS, Cat 1FHR, UCs q 2-5mi65m SVE 4/70/-2 Expectant management,  check cbgs q 4hr latent then q2 active, continue metformin 500 BID KimbRoma SchanzM, WHNPValley Surgical Center Ltd/2023 7:54 AM

## 2022-01-28 NOTE — Progress Notes (Deleted)
Pt informed that the ultrasound is considered a limited OB ultrasound and is not intended to be a complete ultrasound exam.  Patient also informed that the ultrasound is not being completed with the intent of assessing for fetal or placental anomalies or any pelvic abnormalities.  Explained that the purpose of today’s ultrasound is to assess for  BPP, presentation, and AFI.  Patient acknowledges the purpose of the exam and the limitations of the study.    ° °Thailan Sava H RN BSN °01/28/22 ° °

## 2022-01-28 NOTE — Progress Notes (Signed)
S: Called to the room due to the patient feeling vaginal pressure. Patient has no concerns/questions at this time.  O: Vitals:   01/28/22 0301 01/28/22 0331 01/28/22 0401 01/28/22 0431  BP: 107/69 109/65 106/60 110/66  Pulse: 98 83 88 90  Resp:      Temp:      TempSrc:      SpO2:      Weight:      Height:        FHT:  FHR: 140 bpm, variability: moderate,  accelerations:  Present,  decelerations:  Absent UC:   irregular, every 1-5 minutes SVE:   Dilation: 6.5 Effacement (%): 80 Station: -2 Exam by: A. Gamaliel Charney, SNM  A / P: 41 y.o. [id number] [redacted]w[redacted]d here for Spontaneous labor, progressing normally -GBS negative   -Expectant management  Fetal Wellbeing:  Category I Pain Control:  Epidural Anticipated MOD:  NSVD  Rebecca Carlson 01/28/2022, 4:46 AM

## 2022-01-28 NOTE — Progress Notes (Addendum)
S: Patient feeling vaginal pressure, but otherwise comfortable. Has no concerns at this time. Electronic interpreter utilized.   O: Vitals:   01/28/22 0531 01/28/22 0601 01/28/22 0631 01/28/22 0701  BP: 100/63 99/64 108/65 124/70  Pulse: 81 85 86 84  Resp:      Temp:      TempSrc:      SpO2:      Weight:      Height:        FHT:  FHR: 140 bpm, variability: moderate,  accelerations:  Present,  decelerations:  Absent UC:   irregular, every 1-4 minutes SVE:   Dilation: 6.5 Effacement (%): 80 Station: -2 Exam by:: A. Jiselle Sheu,SNM AROM @0709 , clear fluid   A / P: 41 y.o.G6P5005 [redacted]w[redacted]d here for spontaneous labor   -Discussed AROM for labor progression and patient agrees with the plan.  -A2GDM on Metformin- will give morning dose of Metformin and will monitor CBG's Q2 hr and will initiate Endotool if CBG's >125 -GBS negative Fetal Wellbeing:  Category I Pain Control:  Epidural Anticipated MOD:  NSVD  Christiane Ha, SNM 01/28/2022, 7:10 AM

## 2022-01-28 NOTE — Anesthesia Postprocedure Evaluation (Signed)
Anesthesia Post Note  Patient: Rebecca Carlson  Procedure(s) Performed: AN AD HOC LABOR EPIDURAL     Patient location during evaluation: Mother Baby Anesthesia Type: Epidural Level of consciousness: awake and alert Pain management: pain level controlled Vital Signs Assessment: post-procedure vital signs reviewed and stable Respiratory status: spontaneous breathing, nonlabored ventilation and respiratory function stable Cardiovascular status: stable Postop Assessment: no headache, no backache and epidural receding Anesthetic complications: no   No notable events documented.  Last Vitals:  Vitals:   01/28/22 1145 01/28/22 1245  BP: 121/80 99/72  Pulse: 71 72  Resp: 16 16  Temp: 37.2 C 36.9 C  SpO2: 100% 100%    Last Pain:  Vitals:   01/28/22 1245  TempSrc: Axillary  PainSc: 0-No pain   Pain Goal: Patients Stated Pain Goal: 0 (01/27/22 2254)                 Fanny Dance

## 2022-01-28 NOTE — Lactation Note (Signed)
This note was copied from a baby's chart. Lactation Consultation Note  Patient Name: Girl Alisse Tuite LKGMW'N Date: 01/28/2022 Reason for consult: Initial assessment;Maternal endocrine disorder  Early term Age:40 hours  P6, Ex Bf. Arabic interpreter used via video Marwa. Recommend offering breast before formula to help establish mother's milk supply. Feed on demand with cues.  Goal 8-12+ times per day after first 24 hrs.  Place baby STS if not cueing.  Mom made aware of O/P services, breastfeeding support groups, community resources, and our phone # for post-discharge questions.  Suggest calling for latch assistance as needed.  Maternal Data Has patient been taught Hand Expression?: Yes Does the patient have breastfeeding experience prior to this delivery?: Yes How long did the patient breastfeed?: 1.5 years apprx with each child  Feeding Mother's Current Feeding Choice: Breast Milk and Formula Nipple Type: Slow - flow    Interventions Interventions: Breast feeding basics reviewed;Education;LC Services brochure  Discharge    Consult Status Consult Status: Follow-up Date: 01/29/22 Follow-up type: In-patient    Dahlia Byes [facility] 01/28/2022, 12:57 PM

## 2022-01-28 NOTE — Discharge Summary (Signed)
Postpartum Discharge Summary  (Visit conducted with Arabic video interpreter)     Patient Name: Rebecca Carlson DOB: 1981/06/05 MRN: [id number]  Date of admission: 01/27/2022 Delivery date:01/28/2022  Delivering provider: Renard Matter  Date of discharge: 01/29/2022  Admitting diagnosis: Normal labor [O80, Z37.9] Intrauterine pregnancy: [redacted]w[redacted]d    Secondary diagnosis:  Active Problems:   Female circumcision   Previous cesarean section--subsequent VBAC x 5   Supervision of high risk pregnancy, antepartum   AMA (advanced maternal age) multigravida 35+, third trimester   Anemia affecting pregnancy in third trimester   Gestational diabetes mellitus in pregnancy, controlled by oral hypoglycemic drugs   Language barrier   Normal labor  Additional problems: None    Discharge diagnosis: Term Pregnancy Delivered and GDM A2                                              Post partum procedures: Nexplanon placed on PPD#1 Augmentation: AROM Complications: None  Hospital course: Onset of Labor With Vaginal Delivery      41y.o. yo [id number] 314w6das admitted in Active Labor on 01/27/2022. Patient had an uncomplicated labor course as follows:  Membrane Rupture Time/Date: 7:09 AM ,01/28/2022   Delivery Method:VBAC, Spontaneous  Episiotomy: None  Lacerations:  None  Patient had an uncomplicated postpartum course.  She is ambulating, tolerating a regular diet, passing flatus, and urinating well. She had a Nexplanon placed on PPD#1. Patient is discharged home in stable condition on 01/29/22 per her request for early d/c.  Newborn Data: Birth date:01/28/2022  Birth time:9:43 AM  Gender:Female  Living status:Living  Apgars:8 ,9  Weight:3095 g   Magnesium Sulfate received: No BMZ received: No Rhophylac:N/A MMR:N/A T-DaP:Given prenatally Flu: Given prenatally Transfusion:No  Physical exam  Vitals:   01/28/22 1710 01/28/22 2014 01/28/22 2345 01/29/22 0520  BP: 109/75 120/86 105/70 (!)  87/59  Pulse: 81 88 75 78  Resp: _0 Temp: 98.9 F (37.2 C) 98.4 F (36.9 C) 98 F (36.7 C) 98.3 F (36.8 C)  TempSrc: Axillary Oral Oral Oral  SpO2: 100% 100% 98% 99%  Weight:      Height:       General: alert and cooperative Lochia: appropriate Uterine Fundus: firm Incision: N/A DVT Evaluation: No evidence of DVT seen on physical exam. Labs: Lab Results  Component Value Date   WBC 12.1 (H) 01/28/2022   HGB 11.5 (L) 01/28/2022   HCT 34.2 (L) 01/28/2022   MCV 87.7 01/28/2022   PLT 220 01/28/2022   CMP Latest Ref Rng & Units 01/22/2020  Glucose 70 - 99 mg/dL 132(H)  BUN 6 - 20 mg/dL 6  Creatinine 0.44 - 1.00 mg/dL 0.56  Sodium 135 - 145 mmol/L 137  Potassium 3.5 - 5.1 mmol/L 3.8  Chloride 98 - 111 mmol/L 105  CO2 22 - 32 mmol/L 22  Calcium 8.9 - 10.3 mg/dL 9.2  Total Protein 6.5 - 8.1 g/dL 6.7  Total Bilirubin 0.3 - 1.2 mg/dL 0.5  Alkaline Phos 38 - 126 U/L 48  AST 15 - 41 U/L 16  ALT 0 - 44 U/L 13   Edinburgh Score: Edinburgh Postnatal Depression Scale Screening Tool 01/29/2022  I have been able to laugh and see the funny side of things. 0  I have looked forward with enjoyment to things. 0  I  have blamed myself unnecessarily when things went wrong. 0  I have been anxious or worried for no good reason. 0  I have felt scared or panicky for no good reason. 0  Things have been getting on top of me. 0  I have been so unhappy that I have had difficulty sleeping. 0  I have felt sad or miserable. 0  I have been so unhappy that I have been crying. 0  The thought of harming myself has occurred to me. 0  Edinburgh Postnatal Depression Scale Total 0     After visit meds:  Allergies as of 01/29/2022   No Known Allergies      Medication List     STOP taking these medications    Accu-Chek Guide Me w/Device Kit   Accu-Chek Guide test strip Generic drug: glucose blood   Accu-Chek Softclix Lancets lancets   metFORMIN 500 MG tablet Commonly known as:  GLUCOPHAGE       TAKE these medications    albuterol 108 (90 Base) MCG/ACT inhaler Commonly known as: VENTOLIN HFA Inhale 2 puffs into the lungs every 6 (six) hours as needed for wheezing or shortness of breath.   ferrous sulfate 325 (65 FE) MG tablet Commonly known as: FerrouSul Take 1 tablet (325 mg total) by mouth every other day.   ibuprofen 600 MG tablet Commonly known as: ADVIL Take 1 tablet (600 mg total) by mouth every 6 (six) hours as needed.   prenatal vitamin w/FE, FA 27-1 MG Tabs tablet Take 1 tablet by mouth daily at 12 noon.   Spacer/Aero-Holding [employer] Use device as needed with inhalers         Discharge home in stable condition Infant Feeding: Breast Infant Disposition:home with mother Discharge instruction: per After Visit Summary and Postpartum booklet. Activity: Advance as tolerated. Pelvic rest for 6 weeks.  Diet: routine diet Future Appointments: Future Appointments  Date Time Provider [place]  02/27/2022  8:40 AM [facility]  02/27/2022  9:35 AM Luvenia Redden, PA-C [facility]   Follow up Visit: Message sent to [facility] by Dr. Cy Blamer on 01/28/2022  Please schedule this patient for a In person postpartum visit in 4 weeks with the following provider: Any provider. Additional Postpartum F/U:2 hour GTT  High risk pregnancy complicated by: GDM Delivery mode:  VBAC, Spontaneous  Anticipated Birth Control:  PP Nexplanon placed   01/29/2022 Myrtis Ser, CNM

## 2022-01-29 DIAGNOSIS — Z30017 Encounter for initial prescription of implantable subdermal contraceptive: Secondary | ICD-10-CM | POA: Diagnosis not present

## 2022-01-29 LAB — GLUCOSE, CAPILLARY: Glucose-Capillary: 76 mg/dL (ref 70–99)

## 2022-01-29 MED ORDER — LIDOCAINE HCL 1 % IJ SOLN
0.0000 mL | Freq: Once | INTRAMUSCULAR | Status: AC | PRN
  Administered 2022-01-29: 20 mL via INTRADERMAL
  Filled 2022-01-29: qty 20

## 2022-01-29 MED ORDER — ETONOGESTREL 68 MG ~~LOC~~ IMPL
68.0000 mg | DRUG_IMPLANT | Freq: Once | SUBCUTANEOUS | Status: AC
  Administered 2022-01-29: 68 mg via SUBCUTANEOUS
  Filled 2022-01-29: qty 1

## 2022-01-29 MED ORDER — IBUPROFEN 600 MG PO TABS: 600.0000 mg | ORAL_TABLET | Freq: Four times a day (QID) | ORAL | 0 refills | Status: DC | PRN

## 2022-01-29 NOTE — Progress Notes (Signed)
At 0537 patient's CBG was 76.

## 2022-01-29 NOTE — Lactation Note (Signed)
This note was copied from a baby's chart. Lactation Consultation Note  Patient Name: Rebecca Carlson Date: 01/29/2022 Reason for consult: Follow-up assessment;Early term 37-38.6wks;Infant weight loss;Other (Comment) (Online interpreter Dalya [phone] Arabic. LC reviewed and updated the doc flow sheets per mom. LC reviewed BF D/C teaching.) Age:40 hours LC provided the hand pump with #24 F and #27 F , the #24 F fits well for today and #27 when the milk comes in.  [facility] reminded mom if she pumps off any milk to feed it  back to the baby.  Mom aware of the [facility] resources after D/C.   Maternal Data    Feeding Mother's Current Feeding Choice: Breast Milk and Formula Nipple Type: Slow - flow  LATCH Score                    Lactation Tools Discussed/Used Hand pump #24 F and #27 F     Interventions Interventions: Breast feeding basics reviewed;Hand pump;Education;LC Services brochure  Discharge Discharge Education: Engorgement and breast care;Warning signs for feeding baby Pump: Manual  Consult Status Consult Status: Complete Date: 01/29/22    Kathrin Greathouse 01/29/2022, 10:58 AM

## 2022-01-29 NOTE — Procedures (Signed)
Post-Placental Nexplanon Insertion Procedure Note  Patient was identified. Informed consent was signed, signed copy in chart. A time-out was performed.    The insertion site was identified 8-10 cm (3-4 inches) from the medial epicondyle of the humerus and 3-5 cm (1.25-2 inches) posterior to (below) the sulcus (groove) between the biceps and triceps muscles of the patient's left arm and marked. The site was prepped and draped in the usual sterile fashion. Pt was prepped with alcohol swab and then injected with 2 cc of 1% lidocaine. The site was prepped with betadine. Nexplanon removed form packaging,  Device confirmed in needle, then inserted full length of needle and withdrawn per handbook instructions. Provider and patient verified presence of the implant in the womans arm by palpation. Pt insertion site was covered with steristrips/adhesive bandage and pressure bandage. There was minimal blood loss. Patient tolerated procedure well.  Patient was given post procedure instructions and Nexplanon user card with expiration date. Condoms were recommended for STI prevention. BUM x 7 days. Patient was asked to keep the pressure dressing on for 24 hours to minimize bruising and keep the adhesive bandage on for 3-5 days. The patient verbalized understanding of the plan of care and agrees.   Lot # See MAR   Kandis Fantasia, SNM, ECU

## 2022-02-02 ENCOUNTER — Encounter: Payer: Self-pay | Admitting: Advanced Practice Midwife

## 2022-02-04 ENCOUNTER — Other Ambulatory Visit: Payer: Medicaid Other

## 2022-02-07 ENCOUNTER — Telehealth (HOSPITAL_COMMUNITY): Payer: Self-pay

## 2022-02-07 NOTE — Telephone Encounter (Signed)
°  No answer. Left message to return nurse call.  Marcelino Duster [facility] 02/07/2022,1639

## 2022-02-11 ENCOUNTER — Other Ambulatory Visit: Payer: Medicaid Other

## 2022-02-11 ENCOUNTER — Other Ambulatory Visit: Payer: Self-pay | Admitting: Lactation Services

## 2022-02-11 DIAGNOSIS — O24415 Gestational diabetes mellitus in pregnancy, controlled by oral hypoglycemic drugs: Secondary | ICD-10-CM

## 2022-02-11 NOTE — Progress Notes (Signed)
Placed order for Nutrition and Diabetes, patient agreeable. Patient reports she is not eating as she is concerned with GDM diagnosis and wants to wait until after her repeat 2 hour glucose. Reviewed with her that she needs to eat and needs to eat more calories to make breast milk. Her milk supply is low currently as infant was not feeding well for over a week. Reviewed with patient that if she is concerned, to eat like she was taught during the pregnancy and then add an additional 500 calories for breast milk production. Patient voiced understanding.

## 2022-02-24 NOTE — Progress Notes (Deleted)
Pt was not seen in office today.  Koren Bound RN BSN

## 2022-02-25 ENCOUNTER — Other Ambulatory Visit: Payer: Self-pay

## 2022-02-25 DIAGNOSIS — E639 Nutritional deficiency, unspecified: Secondary | ICD-10-CM

## 2022-02-25 NOTE — Progress Notes (Signed)
Patient to follow up with Levada Dy, RD postpartum. New referral needed as patient is no longer considered to have gestational diabetes. ?

## 2022-02-26 ENCOUNTER — Other Ambulatory Visit: Payer: Self-pay

## 2022-02-26 ENCOUNTER — Other Ambulatory Visit: Payer: Self-pay | Admitting: General Practice

## 2022-02-26 ENCOUNTER — Other Ambulatory Visit: Payer: Medicaid Other

## 2022-02-26 ENCOUNTER — Ambulatory Visit (INDEPENDENT_AMBULATORY_CARE_PROVIDER_SITE_OTHER): Payer: Medicaid Other | Admitting: Registered"

## 2022-02-26 ENCOUNTER — Encounter: Payer: Medicaid Other | Attending: Obstetrics & Gynecology | Admitting: Registered"

## 2022-02-26 DIAGNOSIS — O24419 Gestational diabetes mellitus in pregnancy, unspecified control: Secondary | ICD-10-CM | POA: Insufficient documentation

## 2022-02-26 DIAGNOSIS — Z3A00 Weeks of gestation of pregnancy not specified: Secondary | ICD-10-CM | POA: Insufficient documentation

## 2022-02-26 DIAGNOSIS — E639 Nutritional deficiency, unspecified: Secondary | ICD-10-CM

## 2022-02-26 DIAGNOSIS — Z8632 Personal history of gestational diabetes: Secondary | ICD-10-CM

## 2022-02-26 NOTE — Progress Notes (Signed)
In-person interpreter Nuha from [facility] ? ?Medical Nutrition Therapy  ?Appointment Start time:  1015  Appointment End time:  1100 ? ?Primary concerns today: Patient is afraid to eat due to fear of high blood sugar  ?Referral diagnosis: E63.9 (ICD-10-CM) - Inadequate dietary intake ?Preferred learning style: no preference indicated ?Learning readiness: ready ? ?NUTRITION ASSESSMENT  ? ?Anthropometrics  ?Wt Readings from Last 3 Encounters:  ?02/26/22 126 lb 9.6 oz (57.4 kg)  ?01/27/22 137 lb (62.1 kg)  ?01/21/22 139 lb 14.4 oz (63.5 kg)  ?  ? ?Clinical ?Medical Hx: reviewed (GDM) ?Medications: reviewed ?Labs: reviewed ?Notable Signs/Symptoms: stated elevated stress, weight loss ? ?Lifestyle & Dietary Hx ?Pt states she wants to know if she can go back to eating sugar and some of the foods she ate prior to making changes to control GDM.  ? ?Pt states she has continued to check her blood sugar occasionally, reporting one value of 98 mg/dL 2 hours after a meal that included rice. ? ?Pt states sometimes she eats 2 medjool dates with peanuts. ? ?Pt states she is feeling some stress because care of her infant after premature birth.  ? ?24-Hr Dietary Recall ?Not assessed ? ?Estimated Energy Needs ?Not assessed ? ? ?NUTRITION DIAGNOSIS  ?NB-1.1 Food and nutrition-related knowledge deficit As related to changes after delivery of placenta and goals for blood sugar.  As evidenced by fear of eating and not understanding changes in hormones that affect blood sugar. ? ? ?NUTRITION INTERVENTION  ?Nutrition education (E-1) on the following topics:  ?Hormonal changes postpartum that change blood sugar ? ?Handouts Provided Include  ?Gleaners Handout in Arabic Engineer, maintenance (IT) for Healthy Adults) ? ?Learning Style & Readiness for Change ?Teaching method utilized: Visual & Auditory  ?Demonstrated degree of understanding via: Teach Back  ?Barriers to learning/adherence to lifestyle change: none ? ?Goals Established by Pt ?Continue to  check blood sugar occasionally to verify your blood sugar is in a good range ?Continue to follow up with your healthcare provider for wellness exams with A1c testing every 1-3 years  ?Aim to eat balanced meals and snacks ?Aim to eat 5 servings of fruits and vegetables ?When eating fruits, aim for just 1 serving at a time and include protein ? ? ?MONITORING & EVALUATION ?Dietary intake, weekly physical activity, and weight prn. ? ?Next Steps  ?Patient is to use hunger & fullness cues for portion sizes, return for follow-up if desired.  ?

## 2022-02-27 ENCOUNTER — Ambulatory Visit (INDEPENDENT_AMBULATORY_CARE_PROVIDER_SITE_OTHER): Payer: Medicaid Other | Admitting: Medical

## 2022-02-27 ENCOUNTER — Other Ambulatory Visit: Payer: Medicaid Other

## 2022-02-27 ENCOUNTER — Encounter: Payer: Self-pay | Admitting: Medical

## 2022-02-27 VITALS — BP 111/75 | HR 81 | Wt 121.6 lb

## 2022-02-27 DIAGNOSIS — Z98891 History of uterine scar from previous surgery: Secondary | ICD-10-CM

## 2022-02-27 DIAGNOSIS — Z8632 Personal history of gestational diabetes: Secondary | ICD-10-CM | POA: Diagnosis not present

## 2022-02-27 NOTE — Progress Notes (Signed)
? ? ?Post Partum Visit Note ? ?Rebecca Carlson is a 41 y.o. [id number] female who presents for a postpartum visit. She is  4.2  weeks postpartum following a normal spontaneous vaginal delivery (VBAC).  I have fully reviewed the prenatal and intrapartum course. The delivery was at 37.6 gestational weeks.  Anesthesia: epidural. Postpartum course has been going well per patient. Baby is doing well. Baby is feeding by both breast and bottle - Lucien Mons Start . Bleeding no bleeding. Bowel function is normal. Bladder function is normal. Patient is not sexually active. Contraception method is Nexplanon. Postpartum depression screening: negative. ? ? Upstream - 02/27/22 0931   ? ?  ? Pregnancy Intention Screening  ? Does the patient want to become pregnant in the next year? No   ? Does the patient's partner want to become pregnant in the next year? No   ? Would the patient like to discuss contraceptive options today? No   ?  ? Contraception Wrap Up  ? Current Method Hormonal Implant   ? End Method Hormonal Implant   ? Contraception Counseling Provided No   ? ?  ?  ? ?  ? ?The pregnancy intention screening data noted above was reviewed. Potential methods of contraception were discussed. The patient elected to proceed with Hormonal Implant. ? ? Edinburgh Postnatal Depression Scale - 02/27/22 0922   ? ?  ? Edinburgh Postnatal Depression Scale:  In the Past 7 Days  ? I have been able to laugh and see the funny side of things. 0   ? I have looked forward with enjoyment to things. 0   ? I have blamed myself unnecessarily when things went wrong. 0   ? I have been anxious or worried for no good reason. 0   ? I have felt scared or panicky for no good reason. 0   ? Things have been getting on top of me. 0   ? I have been so unhappy that I have had difficulty sleeping. 0   ? I have felt sad or miserable. 0   ? I have been so unhappy that I have been crying. 0   ? The thought of harming myself has occurred to me. 0   ?  Edinburgh Postnatal Depression Scale Total 0   ? ?  ?  ? ?  ? ? ?Health Maintenance Due  ?Topic Date Due  ? URINE MICROALBUMIN  Never done  ? ? ?The following portions of the patient's history were reviewed and updated as appropriate: allergies, current medications, past family history, past medical history, past social history, past surgical history, and problem list. ? ?Review of Systems ?Pertinent items are noted in HPI. ? ?Objective:  ?BP 111/75   Pulse 81   Wt 121 lb 9.6 oz (55.2 kg)   LMP 05/08/2021   Breastfeeding Yes   BMI 23.75 kg/m?   ? ?General:  alert and cooperative  ? Breasts:  not indicated  ?Lungs: Normal effort  ?Heart:  Normal rate  ?Abdomen: Soft, non-tender    ?Wound N/A  ?GU exam:  not indicated  ?     ?Assessment:  ? ?Normal postpartum exam.  ?History of GDM ? ?Plan:  ? ?Essential components of care per ACOG recommendations: ? ?1.  Mood and well being: Patient with negative depression screening today. Reviewed local resources for support.  ?- Patient tobacco use? No.   ?- hx of drug use? No.   ? ?2. Infant care and feeding:  ?-  Patient currently breastmilk feeding? Yes. Reviewed importance of draining breast regularly to support lactation.  ?-Social determinants of health (SDOH) reviewed in EPIC. No concerns ? ?3. Sexuality, contraception and birth spacing ?- Patient does not want a pregnancy in the next year.  Desired family size is 6 children.  ?- Reviewed reproductive life planning. Reviewed contraceptive methods based on pt preferences and effectiveness.  Patient had Nexplanon placed PP.   ?- Patient asking about OCPs for bleeding regulation with Nexplanon, since she is not bleeding now, this may not be indicated. Discussed this with the patient and she is agreeable to waiting to determine her bleeding pattern as it evolves in the PP period. She will contact the office if this changes.  ? ?4. Sleep and fatigue ?-Encouraged family/partner/community support of 4 hrs of uninterrupted sleep  to help with mood and fatigue ? ?5. Physical Recovery  ?- Discussed patients delivery and complications. She describes her labor as mixed. Labor was long and that made it difficult ?- Patient had a Vaginal, no problems at delivery. Patient had a  FGC scar deinfibulated and re approximated . Perineal healing reviewed. Patient expressed understanding ?- Patient has urinary incontinence? No. ?- Patient is not safe to resume physical and sexual activity ? ?6.  Health Maintenance ?- HM due items addressed Yes ?- Last pap smear  ?Diagnosis  ?Date Value Ref Range Status  ?04/04/2020   Final  ? - Negative for intraepithelial lesion or malignancy (NILM)  ? Pap smear not done at today's visit.  ?-Breast Cancer screening indicated? No.  ? ?7. Chronic Disease/Pregnancy Condition follow up: Gestational Diabetes ?- Will return tof 2 hour GTT ? ?Vonzella Nipple, PA-C ?Center for [employer], [facility]  ?

## 2022-03-02 ENCOUNTER — Other Ambulatory Visit: Payer: Self-pay

## 2022-03-05 ENCOUNTER — Other Ambulatory Visit: Payer: Self-pay

## 2022-03-05 ENCOUNTER — Other Ambulatory Visit: Payer: Self-pay | Admitting: General Practice

## 2022-03-05 ENCOUNTER — Other Ambulatory Visit: Payer: Medicaid Other

## 2022-03-06 LAB — GLUCOSE TOLERANCE, 2 HOURS
Glucose, 2 hour: 99 mg/dL (ref 70–139)
Glucose, GTT - Fasting: 88 mg/dL (ref 70–99)

## 2022-03-12 ENCOUNTER — Encounter: Payer: Self-pay | Admitting: *Deleted

## 2022-03-30 ENCOUNTER — Ambulatory Visit (HOSPITAL_COMMUNITY)
Admission: EM | Admit: 2022-03-30 | Discharge: 2022-03-30 | Disposition: A | Discharge status: Home / Self Care | Payer: Medicaid Other | Attending: Family Medicine | Admitting: Family Medicine

## 2022-03-30 ENCOUNTER — Encounter (HOSPITAL_COMMUNITY): Payer: Self-pay | Admitting: Emergency Medicine

## 2022-03-30 DIAGNOSIS — R61 Generalized hyperhidrosis: Secondary | ICD-10-CM

## 2022-03-30 DIAGNOSIS — R6883 Chills (without fever): Secondary | ICD-10-CM | POA: Diagnosis not present

## 2022-03-30 LAB — POCT URINALYSIS DIPSTICK, ED / UC
Bilirubin Urine: NEGATIVE
Glucose, UA: NEGATIVE mg/dL
Ketones, ur: NEGATIVE mg/dL
Leukocytes,Ua: NEGATIVE
Nitrite: NEGATIVE
Protein, ur: NEGATIVE mg/dL
Specific Gravity, Urine: 1.025 (ref 1.005–1.030)
Urobilinogen, UA: 0.2 mg/dL (ref 0.0–1.0)
pH: 6 (ref 5.0–8.0)

## 2022-03-30 LAB — CBG MONITORING, ED: Glucose-Capillary: 102 mg/dL — ABNORMAL HIGH (ref 70–99)

## 2022-03-30 LAB — POC URINE PREG, ED: Preg Test, Ur: NEGATIVE

## 2022-03-30 NOTE — ED Triage Notes (Signed)
Pt is present today with night sweats and chills. Pt states her sx started two months ago  ?

## 2022-03-30 NOTE — Discharge Instructions (Addendum)
Please schedule an appointment with your primary doctor for full complete physical. ?Your blood sugar and vital signs are all stable here today.  ?Suspect since this only occurring at night this is more hormonal.  ? ?

## 2022-03-30 NOTE — ED Provider Notes (Signed)
?[facility] ? ? ? ?CSN: [id number] ?Arrival date & time: 03/30/22  1620 ? ? ?  ? ?History   ?Chief Complaint ?Chief Complaint  ?Patient presents with  ? Night Sweats  ? Chills  ? ? ?HPI ?Rebecca Carlson is a 41 y.o. female.  ? ?HPI ?Patient presents with concern for illness. She reports experiencing night sweats and chills which started two months ago. She denies any other symptoms. She recently has a baby 2 months ago. While pregnant she did develop gestational diabetes however her blood sugars have been stable since delivery.  She reports feeling fine during the day but is concerned as the symptoms developed anytime she goes to sleep.  She is also breast-feeding.  She has had a postpartum evaluation by GYN although has not had a routine physical exam. ?Past Medical History:  ?Diagnosis Date  ? Gestational diabetes   ? Medical history non-contributory   ? Previous cesarean section--subsequent VBAC x 3 12/03/2015  ? TOLAC consent signed 10/29/21  ? ? ?Patient Active Problem List  ? Diagnosis Date Noted  ? Inadequate dietary intake 02/26/2022  ? Normal labor 01/28/2022  ? Language barrier 12/08/2021  ? Iron deficiency anemia 11/21/2021  ? History of gestational diabetes 11/21/2021  ? Previous cesarean section--subsequent VBAC x 5 12/03/2015  ? 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 ? ?Past Surgical History:  ?Procedure Laterality Date  ? CESAREAN SECTION    ? CESAREAN SECTION    ? ? ?OB History   ? ? Gravida  ?6  ? Para  ?6  ? Term  ?6  ? Preterm  ?0  ? AB  ?0  ? Living  ?6  ?  ? ? SAB  ?0  ? IAB  ?0  ? Ectopic  ?0  ? Multiple  ?0  ? Live Births  ?6  ?   ?  ?  ? ? ? ?Home Medications   ? ?Prior to Admission medications   ?Medication Sig Start Date End Date Taking? Authorizing Provider  ?albuterol (VENTOLIN HFA) 108 (90 Base) MCG/ACT inhaler Inhale 2 puffs into the lungs every 6 (six) hours as needed for wheezing or shortness of breath. 10/01/21   Marny Lowenstein, PA-C  ?ferrous sulfate (FERROUSUL) 325 (65  FE) MG tablet Take 1 tablet (325 mg total) by mouth every other day. 11/21/21   Anyanwu, Jethro Bastos, MD  ?ibuprofen (ADVIL) 600 MG tablet Take 1 tablet (600 mg total) by mouth every 6 (six) hours as needed. 01/29/22   Arabella Merles, CNM  ?prenatal vitamin w/FE, FA (PRENATAL 1 + 1) 27-1 MG TABS tablet Take 1 tablet by mouth daily at 12 noon. 11/20/21   Tereso Newcomer, MD  ?Spacer/Aero-Holding Deretha Emory DEVI Use device as needed with inhalers 12/31/21   Venora Maples, MD  ?promethazine (PHENERGAN) 25 MG tablet Take 0.5-1 tablets (12.5-25 mg total) by mouth every 6 (six) hours as needed for refractory nausea / vomiting (headache). 11/07/15 08/02/19  Gavin Pound, MD  ? ? ?Family History ?Family History  ?Problem Relation Age of Onset  ? Diabetes Sister   ? Hypertension Sister   ? ? ?Social History ?Social History  ? ?Tobacco Use  ? Smoking status: Never  ? Smokeless tobacco: Never  ?Vaping Use  ? Vaping Use: Never used  ?Substance Use Topics  ? Alcohol use: No  ? Drug use: No  ? ? ? ?Allergies   ?Patient has no known allergies. ? ? ?Review of Systems ?Review  of Systems ?Pertinent negatives listed in HPI  ?Physical Exam ?Triage Vital Signs ?ED Triage Vitals  ?Enc Vitals Group  ?   BP 03/30/22 1812 119/76  ?   Pulse Rate 03/30/22 1812 64  ?   Resp 03/30/22 1812 18  ?   Temp 03/30/22 1812 99.6 ?F (37.6 ?C)  ?   Temp Source 03/30/22 1812 Oral  ?   SpO2 03/30/22 1812 97 %  ?   Weight --   ?   Height --   ?   Head Circumference --   ?   Peak Flow --   ?   Pain Score 03/30/22 1810 0  ?   Pain Loc --   ?   Pain Edu? --   ?   Excl. in GC? --   ? ?No data found. ? ?Updated Vital Signs ?BP 119/76   Pulse 64   Temp 99.6 ?F (37.6 ?C) (Oral)   Resp 18   SpO2 97%   Breastfeeding No  ? ?Visual Acuity ?Right Eye Distance:   ?Left Eye Distance:   ?Bilateral Distance:   ? ?Right Eye Near:   ?Left Eye Near:    ?Bilateral Near:    ? ?Physical Exam ?Constitutional:   ?   Appearance: Normal appearance.  ?Neck:  ?   Thyroid: No  thyroid mass or thyromegaly.  ?Cardiovascular:  ?   Rate and Rhythm: Normal rate and regular rhythm.  ?Pulmonary:  ?   Effort: Pulmonary effort is normal.  ?   Breath sounds: Normal breath sounds and air entry.  ?Musculoskeletal:  ?   Cervical back: Normal range of motion. No spinous process tenderness or muscular tenderness.  ?Lymphadenopathy:  ?   Cervical: No cervical adenopathy.  ?Neurological:  ?   General: No focal deficit present.  ?   Mental Status: She is alert.  ?   GCS: GCS eye subscore is 4. GCS verbal subscore is 5. GCS motor subscore is 6.  ?Psychiatric:     ?   Attention and Perception: Attention normal.     ?   Mood and Affect: Mood normal.     ?   Speech: Speech normal.  ? ? ? ?UC Treatments / Results  ?Labs ?(all labs ordered are listed, but only abnormal results are displayed) ?Labs Reviewed  ?CBG MONITORING, ED - Abnormal; Notable for the following components:  ?    Result Value  ? Glucose-Capillary 102 (*)   ? All other components within normal limits  ?POCT URINALYSIS DIPSTICK, ED / UC - Abnormal; Notable for the following components:  ? Hgb urine dipstick MODERATE (*)   ? All other components within normal limits  ?POC URINE PREG, ED  ? ? ?EKG ? ? ?Radiology ?No results found. ? ?Procedures ?Procedures (including critical care time) ? ?Medications Ordered in UC ?Medications - No data to display ? ?Initial Impression / Assessment and Plan / UC Course  ?I have reviewed the triage vital signs and the nursing notes. ? ?Pertinent labs & imaging results that were available during my care of the patient were reviewed by me and considered in my medical decision making (see chart for details). ? ?  ?Suspect current symptoms are related to hormonal changes associated with recent birth.  However patient has not had a routine physical exam would recommend follow-up with primary care provider for full screening labs.  On evaluation today blood sugar stable.  Urine hCG is negative.  Urine is unremarkable.   Patient  agreed to follow-up with primary care doctor for full physical exam and screening labs. ?Final Clinical Impressions(s) / UC Diagnoses  ? ?Final diagnoses:  ?Night sweats  ?Chills  ? ? ? ?Discharge Instructions   ? ?  ?Please schedule an appointment with your primary doctor for full complete physical. ?Your blood sugar and vital signs are all stable here today.  ?Suspect since this only occurring at night this is more hormonal.  ? ? ? ? ? ?ED Prescriptions   ?None ?  ? ?PDMP not reviewed this encounter. ?  ?Bing Neighbors, FNP ?04/03/22 6962 ? ?

## 2022-04-27 ENCOUNTER — Telehealth: Payer: Self-pay | Admitting: General Practice

## 2022-04-27 NOTE — Telephone Encounter (Signed)
Opened in error

## 2022-07-09 ENCOUNTER — Telehealth: Payer: Self-pay

## 2022-07-09 ENCOUNTER — Ambulatory Visit (INDEPENDENT_AMBULATORY_CARE_PROVIDER_SITE_OTHER): Payer: Medicaid Other | Admitting: Registered"

## 2022-07-09 ENCOUNTER — Encounter: Payer: Medicaid Other | Attending: Internal Medicine | Admitting: Registered"

## 2022-07-09 DIAGNOSIS — O24415 Gestational diabetes mellitus in pregnancy, controlled by oral hypoglycemic drugs: Secondary | ICD-10-CM | POA: Diagnosis present

## 2022-07-09 DIAGNOSIS — Z713 Dietary counseling and surveillance: Secondary | ICD-10-CM | POA: Insufficient documentation

## 2022-07-09 DIAGNOSIS — Z3A00 Weeks of gestation of pregnancy not specified: Secondary | ICD-10-CM | POA: Diagnosis not present

## 2022-07-09 DIAGNOSIS — E639 Nutritional deficiency, unspecified: Secondary | ICD-10-CM

## 2022-07-09 MED ORDER — GLUCOSE BLOOD VI STRP: ORAL_STRIP | 12 refills | Status: DC

## 2022-07-09 MED ORDER — ACCU-CHEK SOFTCLIX LANCETS MISC: 12 refills | Status: DC

## 2022-07-09 NOTE — Telephone Encounter (Signed)
Accucheck strips and lancets sent to pharmacy on file per Marylene Land, RD. Aide Wojnar,RNC

## 2022-07-09 NOTE — Progress Notes (Signed)
In-person interpreter Franco Collet from [facility]   Medical Nutrition Therapy  Follow-Up Visit Appt Start Time: 0924  End Time: 1014  Patient was originally seen on 02/26/22 for Inadequate dietary intake.  Primary concerns today: patient has been limiting diet due to concerns of developing diabetes   NUTRITION ASSESSMENT   Anthropometrics  Pt reports her weight fluctuates 130-133 lbs Wt Readings from Last 3 Encounters:  02/27/22 121 lb 9.6 oz (55.2 kg)  02/26/22 126 lb 9.6 oz (57.4 kg)  01/27/22 137 lb (62.1 kg)      Clinical Medical Hx: GDM, iron deficiency anemia Medications: (ordered more Accu chek supplies today) Labs: no recent labs Notable Signs/Symptoms: joint pain, pain in lower extremities, fatigue  Lifestyle & Dietary Hx Pt reports she is currently breastfeeding, sometimes has hypoglycemic sxs.  Pt states she is not very hungry first thing in the morning and feels it would make her sick to eat much food. Pt states sometimes she is hungry but can't eat and other times she feels like she over eats.  Pt states she was worried about her blood sugar and was planning on just eating salads, cutting everything else out.  Pt states she is checking blood sugar occasionally and has seen values ranging from 80 up to almost 200 a couple of times.  Estimated daily fluid intake: n/a oz Supplements: not assessed Sleep: not assessed Stress / self-care: not assessed Current average weekly physical activity: not assesed  24-Hr Dietary Recall First Meal: 9 am ~3 homemade cookies, milk/tea, fruit Snack:  Second Meal:12 pm eggs, beans OR yogurt with no-sugar Snack:  Third Meal: 3-5 pm salad each night. May have macaroni or rice with it or gravy and bread Snack: fruit Beverages: water, soda, milk   NUTRITION DIAGNOSIS  NB-1.1 Food and nutrition-related knowledge deficit: related to balanced eating to prevent diabetes as evidenced by Pt stated plan to only eat salad to control blood  sugar Status: new (Continue, Resolved, New)  NUTRITION Education: Topics: Balanced Eating Carb counting Simple carbs Blood sugar goals outside of pregnancy  Handouts Provided Include  BG log book  Learning Style & Readiness for Change Teaching method utilized: Visual & Auditory  Demonstrated degree of understanding via: Teach Back  Barriers to learning/adherence to lifestyle change: none  Progress/ Updates on Pt's Goals Continue to eat plant-based proteins such as Fava beans as well as eggs, chicken and yogurt or other proteins you enjoy Consider having protein for your first meal or at least reduce the amount of cookies Aim to replace soda with water, or sugar free options in moderation   MONITORING & EVALUATION Dietary intake, weekly physical activity, and blood sugar in 2.  Next Steps  Patient is to bring log to next visit.

## 2022-07-09 NOTE — Telephone Encounter (Signed)
-----   Message from Carolan Shiver, RD sent at 07/09/2022  1:08 PM EDT ----- Regarding: Rx for Accu chec strips and lancets? Patient delivered in Feb. Can we still send in or renew Rx for Accu chek guide supplies?   She will be starting with new PCP in ~1 month, but I would like her to check BG before then.  Thanks  Marylene Land

## 2022-07-30 ENCOUNTER — Other Ambulatory Visit: Payer: Self-pay

## 2022-07-30 ENCOUNTER — Ambulatory Visit (INDEPENDENT_AMBULATORY_CARE_PROVIDER_SITE_OTHER): Payer: Medicaid Other | Admitting: Registered"

## 2022-07-30 DIAGNOSIS — E639 Nutritional deficiency, unspecified: Secondary | ICD-10-CM

## 2022-07-30 NOTE — Progress Notes (Signed)
In-person interpreter Gerrit Friends from [facility]  Medical Nutrition Therapy  Follow-Up Visit Appt Start Time: 0818  End Time: 0855  Patient was originally seen on 02/26/22 for Inadequate dietary intake.  Primary concerns today: patient has been feeling weak  NUTRITION ASSESSMENT   Anthropometrics   Wt Readings from Last 3 Encounters:  02/27/22 121 lb 9.6 oz (55.2 kg)  02/26/22 126 lb 9.6 oz (57.4 kg)  01/27/22 137 lb (62.1 kg)     Clinical Medical Hx: GDM, iron deficiency anemia Medications: (ordered more Accu chek supplies today) Labs: no recent labs Notable Signs/Symptoms: joint pain, pain in lower extremities, fatigue  Lifestyle & Dietary Hx Pt states since she made changes after last visit has been feeling better and plans to continue eating this way.  Pt reports hypoglycemic sxs after a lunch of macaroni with chicken, flavored water, 2 hr PPBG 90 mg/dL. Ate some candy and BG was 120 mg/dL 2 hrs later  Pt states she feels weak often. Pt states she feels she cannot hold things while cooking dinner. Pt may be going too long without eating. Pt states she has a lot of concerns since postpartum has MD appt at end of month.  Estimated daily fluid intake: n/a oz Supplements: not assessed Sleep: not assessed Stress / self-care: not assessed Current average weekly physical activity: not assesed  24-Hr Dietary Recall Sometimes before meal milk/tea no sugar, sugar-free cookies First Meal: ~3 boiled eggs with ranch, water; red tea with 1/4 spoon sugar. Snack: bread, fat-free spread cheese Second Meal: sauce onion, meat, spinach, tomato, lentils, 1/2 c rice Snack:   Third Meal: not assessed Snack: bread, milk Beverages: water, 2 x/month soda with pizza, milk   NUTRITION DIAGNOSIS  NB-1.1 Food and nutrition-related knowledge deficit: related to balanced eating to prevent diabetes as evidenced by Pt stated plan to only eat salad to control blood sugar Status: Resolved  NUTRITION  Education: Topics: Normal blood sugar goals when not pregnant Simple vs complex carbs Snacks Resistant starch  Handouts Provided Include  none  Learning Style & Readiness for Change Teaching method utilized: Visual & Auditory  Demonstrated degree of understanding via: Teach Back  Barriers to learning/adherence to lifestyle change: none  Progress/ Updates on Pt's Goals Continue eating balanced meals and including protein with breakfast To increase resistant starch in foods, you can try cooking your starchy vegetables, rice and pasta and let them cool before eating (re-heating is fine) A small amount of soda in moderation should be fine (you reported 2x month) To help reduce low-blood sugar sxs, start keeping snacks with you when you are out doing errands so you won't go too long without food. You do not need to check blood sugar daily as your patterns show you are within normal range. Maybe now every couple of months to make sure BG is staying in normal range.   MONITORING & EVALUATION Dietary intake, weekly physical activity, and blood sugar in 2.  Next Steps  Patient is to call for follow-up visits as desired

## 2022-08-11 ENCOUNTER — Ambulatory Visit: Payer: Medicaid Other | Attending: Internal Medicine | Admitting: Internal Medicine

## 2022-08-11 ENCOUNTER — Encounter: Payer: Self-pay | Admitting: Internal Medicine

## 2022-08-11 VITALS — BP 104/72 | HR 75 | Wt 138.0 lb

## 2022-08-11 DIAGNOSIS — M25511 Pain in right shoulder: Secondary | ICD-10-CM

## 2022-08-11 DIAGNOSIS — L309 Dermatitis, unspecified: Secondary | ICD-10-CM

## 2022-08-11 DIAGNOSIS — R079 Chest pain, unspecified: Secondary | ICD-10-CM

## 2022-08-11 DIAGNOSIS — Z862 Personal history of diseases of the blood and blood-forming organs and certain disorders involving the immune mechanism: Secondary | ICD-10-CM

## 2022-08-11 DIAGNOSIS — Z7689 Persons encountering health services in other specified circumstances: Secondary | ICD-10-CM | POA: Diagnosis not present

## 2022-08-11 DIAGNOSIS — G5601 Carpal tunnel syndrome, right upper limb: Secondary | ICD-10-CM | POA: Diagnosis not present

## 2022-08-11 MED ORDER — CYCLOBENZAPRINE HCL 5 MG PO TABS: 5.0000 mg | ORAL_TABLET | Freq: Every evening | ORAL | 0 refills | Status: DC | PRN

## 2022-08-11 MED ORDER — IBUPROFEN 600 MG PO TABS: 600.0000 mg | ORAL_TABLET | Freq: Three times a day (TID) | ORAL | 0 refills | Status: DC | PRN

## 2022-08-12 LAB — CBC
Hematocrit: 35.5 % (ref 34.0–46.6)
Hemoglobin: 12 g/dL (ref 11.1–15.9)
MCH: 30 pg (ref 26.6–33.0)
MCHC: 33.8 g/dL (ref 31.5–35.7)
MCV: 89 fL (ref 79–97)
Platelets: 310 10*3/uL (ref 150–450)
RBC: 4 x10E6/uL (ref 3.77–5.28)
RDW: 11.8 % (ref 11.7–15.4)
WBC: 6.9 10*3/uL (ref 3.4–10.8)

## 2022-11-24 ENCOUNTER — Ambulatory Visit
Admission: RE | Admit: 2022-11-24 | Discharge: 2022-11-24 | Disposition: A | Discharge status: Home / Self Care | Payer: Medicaid Other | Source: Ambulatory Visit | Attending: Internal Medicine | Admitting: Internal Medicine

## 2022-11-24 ENCOUNTER — Ambulatory Visit: Payer: Medicaid Other | Attending: Internal Medicine | Admitting: Internal Medicine

## 2022-11-24 ENCOUNTER — Encounter: Payer: Self-pay | Admitting: Internal Medicine

## 2022-11-24 VITALS — BP 115/77 | HR 73 | Temp 98.4°F | Ht 60.0 in | Wt 133.0 lb

## 2022-11-24 DIAGNOSIS — Z2821 Immunization not carried out because of patient refusal: Secondary | ICD-10-CM

## 2022-11-24 DIAGNOSIS — G8929 Other chronic pain: Secondary | ICD-10-CM

## 2022-11-24 DIAGNOSIS — M79602 Pain in left arm: Secondary | ICD-10-CM | POA: Diagnosis not present

## 2022-11-24 DIAGNOSIS — M79671 Pain in right foot: Secondary | ICD-10-CM

## 2022-11-24 DIAGNOSIS — M25512 Pain in left shoulder: Secondary | ICD-10-CM | POA: Diagnosis not present

## 2022-11-24 DIAGNOSIS — M542 Cervicalgia: Secondary | ICD-10-CM

## 2022-11-24 DIAGNOSIS — J4520 Mild intermittent asthma, uncomplicated: Secondary | ICD-10-CM

## 2022-11-24 DIAGNOSIS — N938 Other specified abnormal uterine and vaginal bleeding: Secondary | ICD-10-CM | POA: Diagnosis not present

## 2022-11-24 DIAGNOSIS — M79672 Pain in left foot: Secondary | ICD-10-CM

## 2022-11-24 MED ORDER — IBUPROFEN 600 MG PO TABS: 600.0000 mg | ORAL_TABLET | Freq: Three times a day (TID) | ORAL | 0 refills | Status: DC | PRN

## 2022-11-24 MED ORDER — SPACER/AERO-HOLDING CHAMBERS DEVI: 0 refills | Status: DC

## 2022-11-24 NOTE — Progress Notes (Unsigned)
Patient ID: Rebecca Carlson, female    DOB: 09-04-1981  MRN: [id number]  CC: Pain (Pain on R & L feet x3-4 mo. Pain on L arm, unable to move arm at times, unable to hold baby bottle. /Nexplanon on L arm. Having a continuous period X3 weeks. /Pt has inhaler but is requesting a spacer. /No to flu vax. )   Subjective: Rebecca Carlson is a 41 y.o. female who presents for f/u with several concerns.  Rebecca Carlson, from [facility] is with her and interprets Her concerns today include:   Reports pain in feet x 3-4 mths.  No initiating factors She points to area around the heels.  No pain on soles. Constant but worse in mornings when she takes first steps.  Worse also when she stands and starts walking after long sitting. Severe pain.  She tries to massage.  Not taking anything.  Pain LT arm from shoulder down x 4 mths. She feels it is from the Nexplanon which was placed 01/29/2022.  No pain over the device which is in the posterior upper arm.   Intermittent; stabbing pain; last about 1/2 hr Endorses numbness in arm but not hand.  No neck pain but pain b/w shoulder blade that moves to LT shoulder Feels it when she wakes through the night to feed baby.  Can not hold bottle for long to feed her baby   Vaginal bleeding x 3 wks Light bleeding; has to change pad 2-3x day Menses Q mth for 5 days.  Except this past cycle that started 11/04/2022 for 5 days then spotting since then. Has appt with GYN later this mth  Request spacer for inhaler albuterol.  She uses the albuterol few times a week.  Hx of asthma.  Declines flu shot.    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 8 g 2   ferrous sulfate (FERROUSUL) 325 (65 FE) MG tablet Take 1 tablet (325 mg total) by mouth every other day. 30 tablet 3   prenatal vitamin w/FE, FA (PRENATAL 1 + 1) 27-1 MG TABS tablet Take 1 tablet by mouth daily at 12 noon. 30 tablet 11   Spacer/Aero-Holding Chambers DEVI Use device as needed with inhalers 1 each 0   Accu-Chek Softclix Lancets lancets Use four times daily as instructed. (Patient not taking: Reported on 11/24/2022) 100 each 12   glucose blood test strip Use as instructed (Patient not taking: Reported on 11/24/2022) 100 each 12   ibuprofen (ADVIL) 600 MG tablet Take 1 tablet (600 mg total) by mouth every 6 (six) hours as needed. (Patient not taking: Reported on 11/24/2022) 30 tablet 0   [DISCONTINUED] promethazine (PHENERGAN) 25 MG tablet Take 0.5-1 tablets (12.5-25 mg total) by mouth every 6 (six) hours as needed for refractory nausea / vomiting (headache). 10 tablet 0   No current facility-administered medications on file prior to visit.    No Known Allergies  Social History   Socioeconomic History  Marital status: Married    Spouse name: Not on file   Number of children: 5   Years of education: high school   Highest education level: Not on file  Occupational History   Not on file  Tobacco Use   Smoking status: Never   Smokeless tobacco: Never  Vaping Use   Vaping Use: Never used  Substance and Sexual Activity   Alcohol use: No   Drug use: No   Sexual activity: Not Currently  Other Topics Concern   Not on file  Social History Narrative   ** Merged History Encounter **       Social Determinants of Health   Financial Resource Strain: Not on file  Food Insecurity: No Food Insecurity (12/24/2021)   Hunger Vital Sign    Worried About Running Out of Food in  the Last Year: Never true    Ran Out of Food in the Last Year: Never true  Transportation Needs: No Transportation Needs (12/24/2021)   PRAPARE - Administrator, Civil Service (Medical): No    Lack of Transportation (Non-Medical): No  Physical Activity: Not on file  Stress: Not on file  Social Connections: Not on file  Intimate Partner Violence: Not on file    Family History  Problem Relation Age of Onset   Diabetes Sister    Hypertension Sister     Past Surgical History:  Procedure Laterality Date   CESAREAN SECTION     CESAREAN SECTION      ROS: Review of Systems Negative except as stated above  PHYSICAL EXAM: BP 115/77 (BP Location: Left Arm, Patient Position: Sitting, Cuff Size: Normal)   Pulse 73   Temp 98.4 F (36.9 C) (Oral)   Ht 5' (1.524 m)   Wt 133 lb (60.3 kg)   LMP 11/04/2022 (Exact Date)   SpO2 99%   BMI 25.97 kg/m   Physical Exam  General appearance - alert, well appearing, and in no distress Mental status - normal mood, behavior, speech, dress, motor activity, and thought processes Musculoskeletal -neck: Tenderness on palpation along the cervical spine.  She has discomfort with passive range of motion of the neck.  Mild tenderness on palpation of the upper to mid thoracic spine. Feet: No edema or erythema noted.  She has tenderness on palpation on the medial and lateral soft tissue of both heels.  No tenderness on palpation of the sole of the feet. Left shoulder: Moderate discomfort on palpation of the anterior and posterior joint.  Moderate discomfort with attempted passive range of motion in all directions. Neuro: Grip 5/5 bilaterally.  Power in both upper extremities proximally and distally 5/5 bilaterally.     Latest Ref Rng & Units 01/22/2020    4:29 PM 11/07/2015   11:25 AM  CMP  Glucose 70 - 99 mg/dL 353  89   BUN 6 - 20 mg/dL 6  <5   Creatinine 6.14 - 1.00 mg/dL 4.31  5.40   Sodium 086 - 145 mmol/L 137  135   Potassium 3.5 - 5.1  mmol/L 3.8  3.9   Chloride 98 - 111 mmol/L 105  108   CO2 22 - 32 mmol/L 22  20   Calcium 8.9 - 10.3 mg/dL 9.2  8.6   Total Protein 6.5 - 8.1 g/dL 6.7  5.6   Total Bilirubin 0.3 - 1.2 mg/dL 0.5  0.5   Alkaline Phos 38 - 126 U/L 48  103   AST 15 - 41 U/L 16  18   ALT 0 - 44 U/L 13  10    Lipid Panel  No results found for: "CHOL", "TRIG", "HDL", "CHOLHDL", "VLDL", "LDLCALC", "LDLDIRECT"  CBC    Component Value Date/Time   WBC 12.1 (H) 01/28/2022 0043   RBC 3.90 01/28/2022 0043   HGB 11.5 (L) 01/28/2022 0043   HGB 10.6 (L) 11/20/2021 0831   HCT 34.2 (L) 01/28/2022 0043   HCT 32.0 (L) 11/20/2021 0831   PLT 220 01/28/2022 0043   PLT 264 11/20/2021 0831   MCV 87.7 01/28/2022 0043   MCV 87 11/20/2021 0831   MCH 29.5 01/28/2022 0043   MCHC 33.6 01/28/2022 0043   RDW 14.0 01/28/2022 0043   RDW 12.7 11/20/2021 0831   LYMPHSABS 2.2 09/29/2021 1624   MONOABS 0.5 11/07/2015 1125   EOSABS 0.8 (H) 09/29/2021 1624   BASOSABS 0.0 09/29/2021 1624   Urine pregnancy test negative ASSESSMENT AND PLAN:  1. Heel pain, bilateral Seems to be more in the soft tissues around the heel.  Advised wearing comfortable shoes.  Will give a trial of ibuprofen to decrease inflammation. - Ambulatory referral to Podiatry - ibuprofen (ADVIL) 600 MG tablet; Take 1 tablet (600 mg total) by mouth every 8 (eight) hours as needed. Take with food  Dispense: 60 tablet; Refill: 0  2. Pain of left upper extremity 3. Neck pain -Even though patient states she does not have neck pain, on exam of the neck, she is uncomfortable.  I suspect most of her pain is coming from the neck and the shoulder.  We will get an x-ray of the neck and of the left shoulder.  Further management will be based on results.  Start ibuprofen as needed. - ibuprofen (ADVIL) 600 MG tablet; Take 1 tablet (600 mg total) by mouth every 8 (eight) hours as needed. Take with food  Dispense: 60 tablet; Refill: 0 - DG Cervical Spine Complete; Future -  ibuprofen (ADVIL) 600 MG tablet; Take 1 tablet (600 mg total) by mouth every 8 (eight) hours as needed. Take with food  Dispense: 60 tablet; Refill: 0  4. Chronic left shoulder pain - DG Shoulder Left; Future - ibuprofen (ADVIL) 600 MG tablet; Take 1 tablet (600 mg total) by mouth every 8 (eight) hours as needed. Take with food  Dispense: 60 tablet; Refill: 0  5. Mild intermittent asthma without complication - Spacer/Aero-Holding Chambers DEVI; Use device as needed with inhalers  Dispense: 1 each; Refill: 0  6. Dysfunctional uterine bleeding Keep upcoming appointment with gynecology. - CBC - POCT urine pregnancy  7. Influenza vaccination declined     Patient was given the opportunity to ask questions.  Patient verbalized understanding of the plan and was able to repeat key elements of the plan.   This documentation was completed using Paediatric nurse.  Any transcriptional errors are unintentional.  No orders of the defined types were placed in this encounter.    Requested Prescriptions    No prescriptions requested or ordered in this encounter    No follow-ups on file.  Jonah Blue, MD, FACP

## 2022-11-25 LAB — CBC
Hematocrit: 38 % (ref 34.0–46.6)
Hemoglobin: 12.2 g/dL (ref 11.1–15.9)
MCH: 28.1 pg (ref 26.6–33.0)
MCHC: 32.1 g/dL (ref 31.5–35.7)
MCV: 88 fL (ref 79–97)
Platelets: 389 10*3/uL (ref 150–450)
RBC: 4.34 x10E6/uL (ref 3.77–5.28)
RDW: 12.1 % (ref 11.7–15.4)
WBC: 10.4 10*3/uL (ref 3.4–10.8)

## 2022-11-25 LAB — POCT URINE PREGNANCY: Preg Test, Ur: NEGATIVE

## 2022-11-26 ENCOUNTER — Other Ambulatory Visit: Payer: Self-pay | Admitting: Medical

## 2022-11-26 DIAGNOSIS — J302 Other seasonal allergic rhinitis: Secondary | ICD-10-CM

## 2022-11-27 ENCOUNTER — Encounter: Payer: Self-pay | Admitting: Internal Medicine

## 2022-12-01 ENCOUNTER — Ambulatory Visit (HOSPITAL_COMMUNITY)
Admission: EM | Admit: 2022-12-01 | Discharge: 2022-12-01 | Disposition: A | Discharge status: Home / Self Care | Payer: Medicaid Other | Attending: Emergency Medicine | Admitting: Emergency Medicine

## 2022-12-01 ENCOUNTER — Encounter (HOSPITAL_COMMUNITY): Payer: Self-pay

## 2022-12-01 DIAGNOSIS — N939 Abnormal uterine and vaginal bleeding, unspecified: Secondary | ICD-10-CM | POA: Diagnosis not present

## 2022-12-01 LAB — CBC
HCT: 38.4 % (ref 36.0–46.0)
Hemoglobin: 12.6 g/dL (ref 12.0–15.0)
MCH: 28.6 pg (ref 26.0–34.0)
MCHC: 32.8 g/dL (ref 30.0–36.0)
MCV: 87.3 fL (ref 80.0–100.0)
Platelets: 347 10*3/uL (ref 150–400)
RBC: 4.4 MIL/uL (ref 3.87–5.11)
RDW: 12.7 % (ref 11.5–15.5)
WBC: 7.3 10*3/uL (ref 4.0–10.5)
nRBC: 0 % (ref 0.0–0.2)

## 2022-12-01 LAB — POC URINE PREG, ED: Preg Test, Ur: NEGATIVE

## 2022-12-01 MED ORDER — MEGESTROL ACETATE 40 MG PO TABS: 40.0000 mg | ORAL_TABLET | Freq: Every day | ORAL | 0 refills | Status: DC

## 2022-12-01 NOTE — ED Triage Notes (Addendum)
Per interpreter, pt c/o vaginal bleeding since 11/15, going through 4 pads a day. C/o cramping, back pain, and dizziness. States saw her PCP had a neg preg test. States has a OBGYN appt 01/01/2023.

## 2022-12-01 NOTE — ED Notes (Signed)
Called [facility] at [phone] spoke with a pt access member who states she doesn't have a sooner appt but will take pts information and have someone call her back. Advised she is Arabic and will need translator.

## 2022-12-01 NOTE — Discharge Instructions (Addendum)
Megace has been sent to the pharmacy, you will take 1 tablet daily until your GYN appointment that is on January 12.   We will call you if any of your test results warrant a change in your plan of care.   Please continue to monitor and watch your symptoms, if you begin having any worsening/ severe symptoms at home please go to your nearest emergency department.

## 2022-12-01 NOTE — ED Provider Notes (Signed)
[facility]    CSN: [id number] Arrival date & time: 12/01/22  0850      History   Chief Complaint Chief Complaint  Patient presents with   Vaginal Bleeding    HPI Rebecca Carlson is a 41 y.o. female.  Patient presents complaining of irregular bleeding that started approximately 1 month ago.  Patient reports having to use 4 maxi pads daily.  Patient denies any passing of clots.  Patient reports left lower quadrant cramping and lower back cramping that has been ongoing for the past month since the bleeding started but has not worsened.  Patient states that she was seen by her PCP this month and a negative urine pregnancy was determined during that visit.  Patient reports that she is beginning to feel lightheaded at times.  Patient denies any syncopal episode or LOC.  Patient denies any history of similar symptoms.  Patient reports having a child last year in 2022.  She states that she has a GYN appointment scheduled for January 12th.  Patient reports having Nexplanon that was placed in February of this year, she states that she has never had issues with irregular bleeding since the Nexplanon was placed.  Arabic interpreter was used.  Patient denies any urinary symptoms, fever, chills, or loss of appetite.  Patient denies any abnormal vaginal discharge.    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 Authorizing Provider  megestrol (MEGACE) 40 MG tablet Take 1 tablet (40 mg total) by mouth daily. 12/01/22  Yes Debby Freiberg, NP  Accu-Chek Softclix Lancets lancets Use four times daily as instructed. Patient not taking: Reported on 11/24/2022 07/09/22   Warden Fillers, MD  albuterol (VENTOLIN HFA) 108 (90 Base) MCG/ACT inhaler Inhale 2 puffs into the lungs every 6 (six) hours as needed for wheezing or shortness of breath. 10/01/21   Marny Lowenstein, PA-C  cyclobenzaprine (FLEXERIL) 5 MG tablet Take 1 tablet (5 mg total) by mouth at bedtime as needed for muscle spasms. 08/11/22   Marcine Matar, MD  etonogestrel (NEXPLANON) 68 MG IMPL implant 1 each by Subdermal route once.    [provider]  ferrous sulfate (FERROUSUL) 325 (65 FE) MG tablet Take 1 tablet (325 mg total) by mouth every other day. 11/21/21   Tereso Newcomer, MD  ferrous sulfate 325 (65 FE) MG EC tablet Take 325 mg by mouth once. Every other day    [provider]  glucose blood test strip Use as instructed Patient not taking: Reported on 11/24/2022 07/09/22   Warden Fillers, MD  ibuprofen (ADVIL) 600 MG tablet Take 1 tablet (600 mg total) by mouth every 8 (eight) hours as needed. Take with food 11/24/22   Marcine Matar, MD  ibuprofen (ADVIL) 600 MG tablet Take 1 tablet (600 mg total) by mouth every 8 (eight) hours as needed. 08/11/22   Marcine Matar, MD  loratadine (CLARITIN) 10 MG tablet Take 10 mg by mouth daily.    [provider]  Prenatal Vit-Fe Fumarate-FA (PRENATAL MULTIVITAMIN) TABS tablet Take 1 tablet by mouth daily at 12 noon.    [provider]  Spacer/Aero-Holding  Rudean Curt Use device as needed with inhalers 11/24/22   Marcine Matar, MD  promethazine (PHENERGAN) 25 MG tablet Take 0.5-1 tablets (12.5-25 mg total) by mouth every 6 (six) hours as needed for refractory nausea / vomiting (headache). 11/07/15 08/02/19  Gavin Pound, MD    Family History Family History  Problem Relation Age of Onset   Diabetes Sister    Hypertension Sister    Diabetes Sister    Valvular heart disease Sister    Thyroid disease Sister    Diabetes Brother     Social History Social History   Tobacco Use   Smoking status: Never    Passive exposure: Never   Smokeless tobacco: Never  Vaping Use   Vaping Use: Never used  Substance Use Topics   Alcohol use: No   Drug use: No     Allergies   Patient has no known allergies.   Review of Systems Review of Systems Per HPI  Physical Exam Triage Vital Signs ED Triage Vitals  Enc Vitals Group     BP 12/01/22 1030 116/79     Pulse Rate 12/01/22 1030 63     Resp 12/01/22 1030 18     Temp 12/01/22 1030 98.8 F (37.1 C)     Temp Source 12/01/22 1030 Oral     SpO2 12/01/22 1030 98 %     Weight --      Height --      Head Circumference --      Peak Flow --      Pain Score 12/01/22 1031 6     Pain Loc --      Pain Edu? --      Excl. in GC? --    No data found.  Updated Vital Signs BP 116/79 (BP Location: Left Arm)   Pulse 63   Temp 98.8 F (37.1 C) (Oral)   Resp 18   LMP 11/04/2022 (Exact Date)   SpO2 98%   Breastfeeding No   Visual Acuity    Physical Exam Vitals and nursing note reviewed.  Constitutional:      Appearance: Normal appearance.  Abdominal:     General: Abdomen is flat. Bowel sounds are normal. There is no distension or abdominal bruit. There are no signs of injury.     Palpations: Abdomen is soft. There is no shifting dullness, fluid wave, hepatomegaly, splenomegaly, mass or pulsatile mass.     Tenderness: There is abdominal tenderness in the left lower quadrant. There is  no right CVA tenderness, left CVA tenderness, guarding or rebound. Negative signs include McBurney's sign.  Neurological:     Mental Status: She is  alert.      UC Treatments / Results  Labs (all labs ordered are listed, but only abnormal results are displayed) Labs Reviewed  CBC  POC URINE PREG, ED    EKG   Radiology No results found.  Procedures Procedures (including critical care time)  Medications Ordered in UC Medications - No data to display  Initial Impression / Assessment and Plan / UC Course  I have reviewed the triage vital signs and the nursing notes.  Pertinent labs & imaging results that were available during my care of the patient were reviewed by me and considered in my medical decision making (see chart for details).     Patient was evaluated for vaginal bleeding.  Urine pregnancy was negative in office.  Etiology of symptoms is not clear, the Nexplanon contraceptive could be causing irregular bleeding.  Patient was made aware that she will need to follow-up with the OB/GYN in January and make sure to present to the appointment.  This urgent care attempted to call the OB/GYN office to move up the appointment, the OB/GYN office said there was no availability at this time and the patient will need to call the office for further direction, the patient was made aware of this.  CBC ordered to rule out anemia due to vaginal bleeding.  Megace ordered, patient made aware to take this medication until her OB/GYN appointment.  Patient was made aware of symptoms that would require immediate follow-up.  Patient verbalized understanding of instructions.  Charting was provided using a a verbal dictation system, charting was proofread for errors, errors may occur which could change the meaning of the information charted.   Final Clinical Impressions(s) / UC Diagnoses   Final diagnoses:  Vaginal bleeding     Discharge Instructions      Megace has been sent to the  pharmacy, you will take 1 tablet daily until your GYN appointment that is on January 12.   We will call you if any of your test results warrant a change in your plan of care.   Please continue to monitor and watch your symptoms, if you begin having any worsening/ severe symptoms at home please go to your nearest emergency department.      ED Prescriptions     Medication Sig Dispense Auth. Provider   megestrol (MEGACE) 40 MG tablet Take 1 tablet (40 mg total) by mouth daily. 32 tablet Debby Freiberg, NP      PDMP not reviewed this encounter.   Debby Freiberg, NP 12/01/22 1600

## 2022-12-10 NOTE — Telephone Encounter (Signed)
Fax refill request received for Albuteral inhaler. I called pt with [employer] interpreter # [phone] and asked if she had requested this medication. Per chart review this was originally prescribed 09/29/21 during pregnancy. Pt stated that she did request the refill. I advised per Vonzella Nipple that we can refill for one inhaler only. She will need to request additional prescription from her PCP.  Pt voiced understanding.

## 2022-12-11 ENCOUNTER — Ambulatory Visit (INDEPENDENT_AMBULATORY_CARE_PROVIDER_SITE_OTHER): Payer: Medicaid Other | Admitting: Podiatry

## 2022-12-11 ENCOUNTER — Ambulatory Visit (INDEPENDENT_AMBULATORY_CARE_PROVIDER_SITE_OTHER): Payer: Medicaid Other

## 2022-12-11 ENCOUNTER — Other Ambulatory Visit: Payer: Self-pay | Admitting: Podiatry

## 2022-12-11 DIAGNOSIS — M722 Plantar fascial fibromatosis: Secondary | ICD-10-CM

## 2022-12-11 DIAGNOSIS — M79672 Pain in left foot: Secondary | ICD-10-CM

## 2022-12-11 NOTE — Progress Notes (Signed)
Subjective:  Patient ID: Rebecca Carlson, female    DOB: 29-Sep-1981,  MRN: [id number]  Chief Complaint  Patient presents with   Foot Pain    Rm 13 Bilateral heel pain x 3 months. Stabbing pains. Pt states she stands a lot at home.     41 y.o. female presents with the above complaint.  Patient presents with bilateral heel pain that has been on for 3 months is progressive gotten worse hurts with pressure hurts with pain.  Is stabbing pain.  She stands a lot at home.  She wanted to get it evaluated she has not seen anyone else prior to seeing me for this.  She would like to discuss treatment options pain scale 7 out of 10.   Review of Systems: Negative except as noted in the HPI. Denies N/V/F/Ch.  Past Medical History:  Diagnosis Date   Gestational diabetes    Medical history non-contributory    Previous cesarean section--subsequent VBAC x 3 12/03/2015   TOLAC consent signed 10/29/21    Current Outpatient Medications:    Accu-Chek Softclix Lancets lancets, Use four times daily as instructed. (Patient not taking: Reported on 11/24/2022), Disp: 100 each, Rfl: 12   albuterol (VENTOLIN HFA) 108 (90 Base) MCG/ACT inhaler, INHALE 2 PUFFS INTO THE LUNGS EVERY 6 HOURS AS NEEDED FOR WHEEZING OR SHORTNESS OF BREATH., Disp: 8.5 g, Rfl: 0   cyclobenzaprine (FLEXERIL) 5 MG tablet, Take 1 tablet (5 mg total) by mouth at bedtime as needed for muscle spasms., Disp: 15 tablet, Rfl: 0   etonogestrel (NEXPLANON) 68 MG IMPL implant, 1 each by Subdermal route once., Disp: , Rfl:    ferrous sulfate (FERROUSUL) 325 (65 FE) MG tablet, Take 1 tablet (325 mg total) by mouth every other day., Disp: 30 tablet, Rfl: 3   ferrous sulfate 325 (65 FE) MG EC tablet, Take 325 mg by mouth once. Every other day, Disp: , Rfl:    glucose blood test strip, Use as instructed (Patient not taking: Reported on 11/24/2022), Disp: 100 each, Rfl: 12   ibuprofen (ADVIL) 600 MG tablet, Take 1 tablet (600 mg total) by mouth every 8  (eight) hours as needed. Take with food, Disp: 60 tablet, Rfl: 0   ibuprofen (ADVIL) 600 MG tablet, Take 1 tablet (600 mg total) by mouth every 8 (eight) hours as needed., Disp: 30 tablet, Rfl: 0   loratadine (CLARITIN) 10 MG tablet, Take 10 mg by mouth daily., Disp: , Rfl:    megestrol (MEGACE) 40 MG tablet, Take 1 tablet (40 mg total) by mouth daily., Disp: 32 tablet, Rfl: 0   Prenatal Vit-Fe Fumarate-FA (PRENATAL MULTIVITAMIN) TABS tablet, Take 1 tablet by mouth daily at 12 noon., Disp: , Rfl:    Spacer/Aero-Holding Chambers DEVI, Use device as needed with inhalers, Disp: 1 each, Rfl: 0  Social History   Tobacco Use  Smoking Status Never   Passive exposure: Never  Smokeless Tobacco Never    No Known Allergies Objective:  There were no vitals filed for this visit. There is no height or weight on file to calculate BMI. Constitutional Well developed. Well nourished.  Vascular Dorsalis pedis pulses palpable bilaterally. Posterior tibial pulses palpable bilaterally. Capillary refill normal to all digits.  No cyanosis or clubbing noted. Pedal hair growth normal.  Neurologic Normal speech. Oriented to person, place, and time. Epicritic sensation to light touch grossly present bilaterally.  Dermatologic Nails well groomed and normal in appearance. No open wounds. No skin lesions.  Orthopedic: Normal joint  ROM without pain or crepitus bilaterally. No visible deformities. Tender to palpation at the calcaneal tuber bilaterally. No pain with calcaneal squeeze bilaterally. Ankle ROM diminished range of motion bilaterally. Silfverskiold Test: positive bilaterally.   Radiographs: Taken and reviewed. No acute fractures or dislocations. No evidence of stress fracture.  Plantar heel spur absent. Posterior heel spur absent.  Pes planovalgus foot structure  Assessment:   1. Plantar fasciitis of left foot   2. Plantar fasciitis of right foot    Plan:  Patient was evaluated and treated  and all questions answered.  Plantar Fasciitis, bilaterally - XR reviewed as above.  - Educated on icing and stretching. Instructions given.  - Injection delivered to the plantar fascia as below. - DME: Plantar fascial brace dispensed to support the medial longitudinal arch of the foot and offload pressure from the heel and prevent arch collapse during weightbearing - Pharmacologic management: None  Procedure: Injection Tendon/Ligament Location: Bilateral plantar fascia at the glabrous junction; medial approach. Skin Prep: alcohol Injectate: 0.5 cc 0.5% marcaine plain, 0.5 cc of 1% Lidocaine, 0.5 cc kenalog 10. Disposition: Patient tolerated procedure well. Injection site dressed with a band-aid.  No follow-ups on file.

## 2022-12-22 ENCOUNTER — Encounter: Payer: Self-pay | Admitting: Podiatry

## 2023-01-01 ENCOUNTER — Ambulatory Visit (INDEPENDENT_AMBULATORY_CARE_PROVIDER_SITE_OTHER): Payer: Medicaid Other | Admitting: Obstetrics and Gynecology

## 2023-01-01 ENCOUNTER — Encounter: Payer: Self-pay | Admitting: Obstetrics and Gynecology

## 2023-01-01 VITALS — BP 106/72 | HR 69 | Ht 62.25 in | Wt 131.8 lb

## 2023-01-01 DIAGNOSIS — L811 Chloasma: Secondary | ICD-10-CM | POA: Diagnosis not present

## 2023-01-01 DIAGNOSIS — N921 Excessive and frequent menstruation with irregular cycle: Secondary | ICD-10-CM | POA: Diagnosis not present

## 2023-01-01 DIAGNOSIS — Z975 Presence of (intrauterine) contraceptive device: Secondary | ICD-10-CM | POA: Diagnosis not present

## 2023-01-01 NOTE — Progress Notes (Deleted)
HPI Pt is a 42 year old female who presents for evaluation of irregular vaginal bleeding.   Pt reports she was started on Nexplanon February 9th, 2023. She had been having regular monthly cycles since that time. From November 15th- December 25th she reports continuous vaginal bleeding with occasional heavy flow. This was associated with intermittent lower abdominal cramping and dizziness. Denies any associated vaginal pain. States this have never happened before. She was seen in the ED on 12/12. Work up was unrevealing and she was prescribed megestrol acetate. She reports bleeding has since stopped, and she never required use of the megestrol.   Pt also reports concern of mild hyperpigmentation beneath her bilateral cheeks. States this began with the vaginal bleeding.    Physical Exam Vitals:   01/01/23 1040  BP: 106/72  Pulse: 69   General: Well appearing, NAD Respiratory: No increased WOB Skin: Mildly hyperpigmented macule beneath the bilateral cheeks Neuro: Alert and oriented x3  Assessment and Plan 42 year old female who presents for evaluation of irregular vaginal bleeding beginning 2 months ago. Pt reports beginning [place] in February of 2023 with symptoms beginning 9 months later. Given she was having regular menstrual periods prior to this, suspect this is the cause of her bleeding. Symptoms have since resolved and pt agreeable to continuing Nexplanon for the time being with plans to use megestrol acetate if heavy bleeding resumes. Did discussed alternate form of birth control.  Pt also complaining of mild hyperpigmentation to the bilateral cheeks. Appears similar to melasma, however UPT was negative. Unclear etiology. Will refer to dermatology.   1) Irregular vaginal bleeding > continue use of Nexplanon with megestrol acetate for breakthrough bleeding > follow up in 3 months  > alternate forms of birth control discussed   2) Skin hyperpigmentation > dermatology follow up

## 2023-01-05 DIAGNOSIS — Z975 Presence of (intrauterine) contraceptive device: Secondary | ICD-10-CM | POA: Insufficient documentation

## 2023-01-05 DIAGNOSIS — N921 Excessive and frequent menstruation with irregular cycle: Secondary | ICD-10-CM

## 2023-01-05 HISTORY — DX: Presence of (intrauterine) contraceptive device: Z97.5

## 2023-01-05 HISTORY — DX: Excessive and frequent menstruation with irregular cycle: N92.1

## 2023-01-05 NOTE — Progress Notes (Signed)
Obstetrics and Gynecology Established Patient Evaluation  Appointment Date: 01/01/2023  OBGYN Clinic: [facility]  Primary Care Provider: Marcine Matar  Referring Provider: Marcine Matar, MD  Chief Complaint:  Chief Complaint  Patient presents with   Contraception    History of Present Illness: Rebecca Carlson is a 42 y.o. [id number] (Patient's last menstrual period was 11/04/2022 (exact date).), seen for the above chief complaint.   Today, she presents for evaluation of irregular vaginal bleeding. she was started on Nexplanon February 9th, 2023 immediately postpartum. She had been having regular monthly cycles since that time. From November 15th- December 25th she reports continuous vaginal bleeding with occasional heavy flow. This was associated with intermittent lower abdominal cramping and dizziness. Denies any associated vaginal pain. States this have never happened before. She was seen in the ED on 12/12 and had negative UPT and CBC was unrevealing and she was prescribed megestrol acetate. She reports bleeding has since stopped, and she never required use of the megestrol.   Pt also reports concern of mild hyperpigmentation beneath her bilateral cheeks. States this began with the vaginal bleeding.   Review of Systems: Pertinent items noted in HPI and remainder of comprehensive ROS otherwise negative.    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    Past Medical History:  Past Medical History:  Diagnosis Date   Gestational diabetes    Medical history non-contributory    Previous cesarean section--subsequent VBAC x 3  12/03/2015   TOLAC consent signed 10/29/21    Past Surgical History:  Past Surgical History:  Procedure Laterality Date   CESAREAN SECTION      Past Obstetrical History:  OB History  Gravida Para Term Preterm AB Living  6 6 6  0 0 6  SAB IAB Ectopic Multiple Live Births  0 0 0   6    # Outcome Date GA Lbr Len/2nd Weight Sex Delivery Anes PTL Lv  6 Term 01/28/22 [redacted]w[redacted]d 12:05 / 00:38 6 lb 13.2 oz (3.095 kg) F VBAC EPI  LIV  5 Term 12/01/15 [redacted]w[redacted]d 05:36 / 01:26 7 lb 9.2 oz (3.435 kg) F VBAC EPI  LIV  4 Term 09/07/11    M VBAC  N LIV  3 Term 02/19/09    M VBAC  N LIV  2 Term 07/21/06    F VBAC  N LIV  1 Term 11/21/03    M CS-Unspec  N LIV     Complications: Breech birth    Past Gynecological History: As per HPI. History of Pap Smear(s): Yes.   Last pap April 2021, which was negative  Social History:  Social History   Socioeconomic History   Marital status: Married    Spouse name: Not on file   Number of children: 5   Years of education: high school   Highest education level: High school graduate  Occupational History   Occupation: Stay at home mom   Occupation: House wife  Tobacco Use   Smoking status: Never    Passive exposure: Never   Smokeless tobacco: Never  Vaping Use   Vaping Use: Never used  Substance and Sexual Activity   Alcohol use: No  Drug use: No   Sexual activity: Yes    Birth control/protection: Implant    Comment: nexplanon placed 01/29/22  Other Topics Concern   Not on file  Social History Narrative   ** Merged History Encounter **       ** Data from: 11/24/22 Enc Dept: [facility]   ** Merged History Encounter **           ** Data from: 08/11/22 Enc Dept: [place]   Can read in [place], speaks a little english   Social Determinants of Health   Financial Resource Strain: Not on file  Food Insecurity: No Food Insecurity (12/24/2021)   Hunger Vital Sign    Worried About Running Out of Food in the Last Year: Never true     Ran Out of Food in the Last Year: Never true  Transportation Needs: No Transportation Needs (12/24/2021)   PRAPARE - Hydrologist (Medical): No    Lack of Transportation (Non-Medical): No  Physical Activity: Not on file  Stress: Not on file  Social Connections: Not on file  Intimate Partner Violence: Not on file    Family History:  Family History  Problem Relation Age of Onset   Diabetes Sister    Hypertension Sister    Diabetes Sister    Valvular heart disease Sister    Thyroid disease Sister    Diabetes Brother     Medications Kyndall A. Reagor had no medications administered during this visit. Current Outpatient Medications  Medication Sig Dispense Refill   albuterol (VENTOLIN HFA) 108 (90 Base) MCG/ACT inhaler INHALE 2 PUFFS INTO THE LUNGS EVERY 6 HOURS AS NEEDED FOR WHEEZING OR SHORTNESS OF BREATH. 8.5 g 0   cyclobenzaprine (FLEXERIL) 5 MG tablet Take 1 tablet (5 mg total) by mouth at bedtime as needed for muscle spasms. 15 tablet 0   etonogestrel (NEXPLANON) 68 MG IMPL implant 1 each by Subdermal route once.     ferrous sulfate 325 (65 FE) MG EC tablet Take 325 mg by mouth once. Every other day     ibuprofen (ADVIL) 600 MG tablet Take 1 tablet (600 mg total) by mouth every 8 (eight) hours as needed. 30 tablet 0   loratadine (CLARITIN) 10 MG tablet Take 10 mg by mouth daily.     megestrol (MEGACE) 40 MG tablet Take 1 tablet (40 mg total) by mouth daily. 32 tablet 0   Prenatal Vit-Fe Fumarate-FA (PRENATAL MULTIVITAMIN) TABS tablet Take 1 tablet by mouth daily at 12 noon.     Spacer/Aero-Holding Dorise Bullion Use device as needed with inhalers 1 each 0   Accu-Chek Softclix Lancets lancets Use four times daily as instructed. (Patient not taking: Reported on 11/24/2022) 100 each 12   glucose blood test strip Use as instructed (Patient not taking: Reported on 11/24/2022) 100 each 12   No current facility-administered medications for this visit.     Allergies Patient has no known allergies.   Physical Exam:  BP 106/72   Pulse 69   Ht 5' 2.25" (1.581 m)   Wt 131 lb 12.8 oz (59.8 kg)   LMP 11/04/2022 (Exact Date) Comment: lasted about 6 weeks; heavy to light bleeding, no breaks in between the 6 weeks; painful  Breastfeeding No   BMI 23.91 kg/m  Body mass index is 23.91 kg/m. General appearance: Well nourished, well developed female in no acute distress.  Face: right greater than left hyperpigmentation c/w melasma. Respiratory:  Normal respiratory effort Neuro/Psych:  Normal mood and affect.  Skin:  Warm and dry.   Laboratory: none  Radiology: none  Assessment: patient stable  Plan:  1. Nexplanon breakthrough bleeding Patient amenable to expectant management; recommend to use megace if has AUB and to contact us  2. Melasma Derm referral made  In person interpreter used  RTC 20m for annual and pap smear  Durene Romans MD Attending Center for [facility])

## 2023-01-12 ENCOUNTER — Ambulatory Visit (INDEPENDENT_AMBULATORY_CARE_PROVIDER_SITE_OTHER): Payer: Medicaid Other | Admitting: Podiatry

## 2023-01-12 DIAGNOSIS — M722 Plantar fascial fibromatosis: Secondary | ICD-10-CM | POA: Diagnosis not present

## 2023-01-12 NOTE — Progress Notes (Signed)
Subjective:  Patient ID: Rebecca Carlson, female    DOB: 1980/12/23,  MRN: [id number]  Chief Complaint  Patient presents with   Plantar Fasciitis    42 y.o. female presents with the above complaint.  Patient presents with bilateral Planter fasciitis.  Patient states that discharge did not help.  The left side is still very painful.  Would like to discuss next treatment plan.   Review of Systems: Negative except as noted in the HPI. Denies N/V/F/Ch.  Past Medical History:  Diagnosis Date   Gestational diabetes    Medical history non-contributory    Previous cesarean section--subsequent VBAC x 3 12/03/2015   TOLAC consent signed 10/29/21    Current Outpatient Medications:    Accu-Chek Softclix Lancets lancets, Use four times daily as instructed. (Patient not taking: Reported on 11/24/2022), Disp: 100 each, Rfl: 12   albuterol (VENTOLIN HFA) 108 (90 Base) MCG/ACT inhaler, INHALE 2 PUFFS INTO THE LUNGS EVERY 6 HOURS AS NEEDED FOR WHEEZING OR SHORTNESS OF BREATH., Disp: 8.5 g, Rfl: 0   cyclobenzaprine (FLEXERIL) 5 MG tablet, Take 1 tablet (5 mg total) by mouth at bedtime as needed for muscle spasms., Disp: 15 tablet, Rfl: 0   etonogestrel (NEXPLANON) 68 MG IMPL implant, 1 each by Subdermal route once., Disp: , Rfl:    ferrous sulfate 325 (65 FE) MG EC tablet, Take 325 mg by mouth once. Every other day, Disp: , Rfl:    glucose blood test strip, Use as instructed (Patient not taking: Reported on 11/24/2022), Disp: 100 each, Rfl: 12   ibuprofen (ADVIL) 600 MG tablet, Take 1 tablet (600 mg total) by mouth every 8 (eight) hours as needed., Disp: 30 tablet, Rfl: 0   loratadine (CLARITIN) 10 MG tablet, Take 10 mg by mouth daily., Disp: , Rfl:    megestrol (MEGACE) 40 MG tablet, Take 1 tablet (40 mg total) by mouth daily., Disp: 32 tablet, Rfl: 0   Prenatal Vit-Fe Fumarate-FA (PRENATAL MULTIVITAMIN) TABS tablet, Take 1 tablet by mouth daily at 12 noon., Disp: , Rfl:    Spacer/Aero-Holding  Ch[place] DEVI, Use device as needed with inhalers, Disp: 1 each, Rfl: 0  Social History   Tobacco Use  Smoking Status Never   Passive exposure: Never  Smokeless Tobacco Never    No Known Allergies Objective:  There were no vitals filed for this visit. There is no height or weight on file to calculate BMI. Constitutional Well developed. Well nourished.  Vascular Dorsalis pedis pulses palpable bilaterally. Posterior tibial pulses palpable bilaterally. Capillary refill normal to all digits.  No cyanosis or clubbing noted. Pedal hair growth normal.  Neurologic Normal speech. Oriented to person, place, and time. Epicritic sensation to light touch grossly present bilaterally.  Dermatologic Nails well groomed and normal in appearance. No open wounds. No skin lesions.  Orthopedic: Normal joint ROM without pain or crepitus bilaterally. No visible deformities. Tender to palpation at the calcaneal tuber bilaterally. No pain with calcaneal squeeze bilaterally. Ankle ROM diminished range of motion bilaterally. Silfverskiold Test: positive bilaterally.   Radiographs: Taken and reviewed. No acute fractures or dislocations. No evidence of stress fracture.  Plantar heel spur absent. Posterior heel spur absent.  Pes planovalgus foot structure  Assessment:   No diagnosis found.  Plan:  Patient was evaluated and treated and all questions answered.  Plantar Fasciitis, bilaterally - XR reviewed as above.  - Educated on icing and stretching. Instructions given.  -No further injection delivered to the plantar fascia as they have not  helped - DME: Cam boot immobilization as the shots have not helped - Pharmacologic management: None   No follow-ups on file.

## 2023-02-09 ENCOUNTER — Encounter: Payer: Self-pay | Admitting: Internal Medicine

## 2023-02-09 ENCOUNTER — Ambulatory Visit: Payer: Medicaid Other | Attending: Internal Medicine | Admitting: Internal Medicine

## 2023-02-09 VITALS — BP 100/67 | HR 60 | Temp 98.1°F | Wt 134.0 lb

## 2023-02-09 DIAGNOSIS — R61 Generalized hyperhidrosis: Secondary | ICD-10-CM

## 2023-02-09 DIAGNOSIS — G5603 Carpal tunnel syndrome, bilateral upper limbs: Secondary | ICD-10-CM

## 2023-02-09 NOTE — Progress Notes (Signed)
Patient ID: Rebecca Carlson, female    DOB: 03-09-1981  MRN: [id number]  CC: Night Sweats   Subjective: Rebecca Carlson is a 42 y.o. female who presents for UC visit Her concerns today include:  Hx of mild intermittent asthma, CTS RT wrist,   Pt c/o night sweats x 1 mth Only at nights, has to change clothes and sheets  No fever or cough, unexplained wgh changes, sick contacts, enlarge LN, diarrhea, rash Endorses occasional heart racing. No recent travel outside of [place]. Last travel outside [place] in 2022 to [place] for 3 mths.  Endorses prior hx of malaria in 2016.  Treated in [place].   Has Nexplanon BC Keeps her thermostat set to 72 degrees.  Also reports intermittent numbness in both hands x 2 wks.  Mainly 1st-4th fingers Occurs mainly at nights also.   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 100 each 12   albuterol (VENTOLIN HFA) 108 (90 Base) MCG/ACT inhaler INHALE 2 PUFFS INTO THE LUNGS EVERY 6 HOURS AS NEEDED FOR WHEEZING OR SHORTNESS OF BREATH. 8.5 g 0   cyclobenzaprine (FLEXERIL) 5 MG tablet Take 1 tablet (5 mg total) by mouth at bedtime as needed for muscle spasms. 15 tablet 0   etonogestrel (NEXPLANON) 68 MG IMPL implant 1 each by Subdermal route once.     ferrous sulfate 325 (65 FE) MG EC tablet Take 325 mg by mouth once. Every other day     glucose blood test strip Use as instructed 100 each 12   ibuprofen  (ADVIL) 600 MG tablet Take 1 tablet (600 mg total) by mouth every 8 (eight) hours as needed. 30 tablet 0   loratadine (CLARITIN) 10 MG tablet Take 10 mg by mouth daily.     megestrol (MEGACE) 40 MG tablet Take 1 tablet (40 mg total) by mouth daily. 32 tablet 0   Prenatal Vit-Fe Fumarate-FA (PRENATAL MULTIVITAMIN) TABS tablet Take 1 tablet by mouth daily at 12 noon.     Spacer/Aero-Holding Josiah Lobo DEVI Use device as needed with inhalers 1 each 0   [DISCONTINUED] promethazine (PHENERGAN) 25 MG tablet Take 0.5-1 tablets (12.5-25 mg total) by mouth every 6 (six) hours as needed for refractory nausea / vomiting (headache). 10 tablet 0   No current facility-administered medications on file prior to visit.    No Known Allergies  Social History   Socioeconomic History   Marital status: Married    Spouse name: Not on file   Number of children: 5   Years of education: high school   Highest education level: High school graduate  Occupational History   Occupation: Stay at home mom   Occupation: House wife  Tobacco Use   Smoking status: Never    Passive exposure: Never   Smokeless tobacco: Never  Vaping Use   Vaping Use: Never used  Substance and Sexual Activity   Alcohol use: No   Drug use: No   Sexual  activity: Yes    Birth control/protection: Implant    Comment: nexplanon placed 01/29/22  Other Topics Concern   Not on file  Social History Narrative   ** Merged History Encounter **       ** Data from: 11/24/22 Enc Dept: [facility]   ** Merged History Encounter **           ** Data from: 08/11/22 Enc Dept: [place]   Can read in [place], speaks a little english   Social Determinants of Health   Financial Resource Strain: Not on file  Food Insecurity: No Food Insecurity (12/24/2021)   Hunger Vital Sign    Worried About Running Out of Food in the Last Year: Never true    Ran Out of Food in the Last Year: Never true  Transportation Needs: No Transportation  Needs (12/24/2021)   PRAPARE - Hydrologist (Medical): No    Lack of Transportation (Non-Medical): No  Physical Activity: Not on file  Stress: Not on file  Social Connections: Not on file  Intimate Partner Violence: Not on file    Family History  Problem Relation Age of Onset   Diabetes Sister    Hypertension Sister    Diabetes Sister    Valvular heart disease Sister    Thyroid disease Sister    Diabetes Brother     Past Surgical History:  Procedure Laterality Date   CESAREAN SECTION      ROS: Review of Systems Negative except as stated above  PHYSICAL EXAM: BP 100/67   Pulse 60   Temp 98.1 F (36.7 C)   Wt 134 lb (60.8 kg)   LMP 12/28/2022   SpO2 100%   Breastfeeding No   BMI 24.31 kg/m   Wt Readings from Last 3 Encounters:  02/09/23 134 lb (60.8 kg)  01/01/23 131 lb 12.8 oz (59.8 kg)  11/24/22 133 lb (60.3 kg)    Physical Exam Constitutional:      Appearance: Normal appearance.  HENT:     Nose: Nose normal.     Mouth/Throat:     Mouth: Mucous membranes are moist.     Pharynx: Oropharynx is clear.  Eyes:     Conjunctiva/sclera: Conjunctivae normal.  Cardiovascular:     Rate and Rhythm: Normal rate and regular rhythm.     Pulses: Normal pulses.     Heart sounds: Normal heart sounds.  Pulmonary:     Effort: Pulmonary effort is normal.     Breath sounds: Normal breath sounds.  Abdominal:     General: There is no distension.     Palpations: There is no mass.  Lymphadenopathy:     Cervical: No cervical adenopathy.   Hands: No wasting of intrinsic muscles of the hands.  Grip 5/5 bilaterally.  Tinel's sign is positive.  Radial pulses 3+ bilaterally.      Latest Ref Rng & Units 01/22/2020    4:29 PM 11/07/2015   11:25 AM  CMP  Glucose 70 - 99 mg/dL 132  89   BUN 6 - 20 mg/dL 6  <5   Creatinine 0.44 - 1.00 mg/dL 0.56  0.55   Sodium 135 - 145 mmol/L 137  135   Potassium 3.5 - 5.1 mmol/L 3.8  3.9   Chloride 98 - 111  mmol/L 105  108   CO2 22 - 32 mmol/L 22  20   Calcium 8.9 - 10.3 mg/dL 9.2  8.6   Total  Protein 6.5 - 8.1 g/dL 6.7  5.6   Total Bilirubin 0.3 - 1.2 mg/dL 0.5  0.5   Alkaline Phos 38 - 126 U/L 48  103   AST 15 - 41 U/L 16  18   ALT 0 - 44 U/L 13  10    CBC    Component Value Date/Time   WBC 7.3 12/01/2022 1120   RBC 4.40 12/01/2022 1120   HGB 12.6 12/01/2022 1120   HGB 12.2 11/24/2022 1525   HCT 38.4 12/01/2022 1120   HCT 38.0 11/24/2022 1525   PLT 347 12/01/2022 1120   PLT 389 11/24/2022 1525   MCV 87.3 12/01/2022 1120   MCV 88 11/24/2022 1525   MCH 28.6 12/01/2022 1120   MCHC 32.8 12/01/2022 1120   RDW 12.7 12/01/2022 1120   RDW 12.1 11/24/2022 1525   LYMPHSABS 2.2 09/29/2021 1624   MONOABS 0.5 11/07/2015 1125   EOSABS 0.8 (H) 09/29/2021 1624   BASOSABS 0.0 09/29/2021 1624    ASSESSMENT AND PLAN: 1. Carpal tunnel syndrome on both sides Discussed diagnosis with patient. Recommend trial of cock up wrist splints.  Prescription given for her to take to any medical supply store to get the cock-up wrist splints. If no improvement with conservative measures or any worsening, she should return so that we can refer for EMG and then to orthopedics. - For home use only DME Other see comment  2. Night sweats Exam is unrevealing. Check baseline blood tests. Advised to turn down her thermostat to about 70 degrees.  Advised to wear breathable clothing to bed like cotton. - TSH+T4F+T3Free - CBC - Comprehensive metabolic panel - HIV antibody (with reflex)   [employer] Language interpreter used during this encounter. [id number], Scott   Patient was given the opportunity to ask questions.  Patient verbalized understanding of the plan and was able to repeat key elements of the plan.   This documentation was completed using Radio producer.  Any transcriptional errors are unintentional.  No orders of the defined types were placed in this encounter.    Requested  Prescriptions    No prescriptions requested or ordered in this encounter    No follow-ups on file.  Karle Plumber, MD, FACP

## 2023-02-09 NOTE — Progress Notes (Signed)
Night sweats x 1 month, reoccuring after having for 2 mos.  1 year ago Denies cough    Tingling in hands

## 2023-02-10 LAB — CBC
Hematocrit: 35.6 % (ref 34.0–46.6)
Hemoglobin: 11.9 g/dL (ref 11.1–15.9)
MCH: 29 pg (ref 26.6–33.0)
MCHC: 33.4 g/dL (ref 31.5–35.7)
MCV: 87 fL (ref 79–97)
Platelets: 349 10*3/uL (ref 150–450)
RBC: 4.1 x10E6/uL (ref 3.77–5.28)
RDW: 11.8 % (ref 11.7–15.4)
WBC: 7.9 10*3/uL (ref 3.4–10.8)

## 2023-02-10 LAB — COMPREHENSIVE METABOLIC PANEL
ALT: 6 IU/L (ref 0–32)
AST: 13 IU/L (ref 0–40)
Albumin/Globulin Ratio: 1.8 (ref 1.2–2.2)
Albumin: 4.3 g/dL (ref 3.9–4.9)
Alkaline Phosphatase: 82 IU/L (ref 44–121)
BUN/Creatinine Ratio: 15 (ref 9–23)
BUN: 10 mg/dL (ref 6–24)
Bilirubin Total: 0.3 mg/dL (ref 0.0–1.2)
CO2: 21 mmol/L (ref 20–29)
Calcium: 9.6 mg/dL (ref 8.7–10.2)
Chloride: 102 mmol/L (ref 96–106)
Creatinine, Ser: 0.68 mg/dL (ref 0.57–1.00)
Globulin, Total: 2.4 g/dL (ref 1.5–4.5)
Glucose: 100 mg/dL — ABNORMAL HIGH (ref 70–99)
Potassium: 4.5 mmol/L (ref 3.5–5.2)
Sodium: 136 mmol/L (ref 134–144)
Total Protein: 6.7 g/dL (ref 6.0–8.5)
eGFR: 112 mL/min/{1.73_m2} (ref >59)

## 2023-02-10 LAB — HIV ANTIBODY (ROUTINE TESTING W REFLEX): HIV Screen 4th Generation wRfx: NONREACTIVE

## 2023-02-10 LAB — TSH+T4F+T3FREE
Free T4: 1.06 ng/dL (ref 0.82–1.77)
T3, Free: 2.7 pg/mL (ref 2.0–4.4)
TSH: 1.3 u[IU]/mL (ref 0.450–4.500)

## 2023-02-16 ENCOUNTER — Ambulatory Visit (INDEPENDENT_AMBULATORY_CARE_PROVIDER_SITE_OTHER): Payer: Medicaid Other | Admitting: Podiatry

## 2023-02-16 DIAGNOSIS — M722 Plantar fascial fibromatosis: Secondary | ICD-10-CM | POA: Diagnosis not present

## 2023-02-16 NOTE — Progress Notes (Signed)
Subjective:  Patient ID: Rebecca Carlson, female    DOB: Sep 06, 1981,  MRN: [id number]  Chief Complaint  Patient presents with   Plantar Fasciitis    42 y.o. female presents with the above complaint.  Patient presents with bilateral Planter fasciitis.  Patient states that discharge did not help.  She states the boot helped some but did not get rid of her pain.  She would like to discuss next treatment plan   Review of Systems: Negative except as noted in the HPI. Denies N/V/F/Ch.  Past Medical History:  Diagnosis Date   Gestational diabetes    Medical history non-contributory    Previous cesarean section--subsequent VBAC x 3 12/03/2015   TOLAC consent signed 10/29/21    Current Outpatient Medications:    Accu-Chek Softclix Lancets lancets, Use four times daily as instructed., Disp: 100 each, Rfl: 12   albuterol (VENTOLIN HFA) 108 (90 Base) MCG/ACT inhaler, INHALE 2 PUFFS INTO THE LUNGS EVERY 6 HOURS AS NEEDED FOR WHEEZING OR SHORTNESS OF BREATH., Disp: 8.5 g, Rfl: 0   cyclobenzaprine (FLEXERIL) 5 MG tablet, Take 1 tablet (5 mg total) by mouth at bedtime as needed for muscle spasms., Disp: 15 tablet, Rfl: 0   etonogestrel (NEXPLANON) 68 MG IMPL implant, 1 each by Subdermal route once., Disp: , Rfl:    ferrous sulfate 325 (65 FE) MG EC tablet, Take 325 mg by mouth once. Every other day, Disp: , Rfl:    glucose blood test strip, Use as instructed, Disp: 100 each, Rfl: 12   ibuprofen (ADVIL) 600 MG tablet, Take 1 tablet (600 mg total) by mouth every 8 (eight) hours as needed., Disp: 30 tablet, Rfl: 0   loratadine (CLARITIN) 10 MG tablet, Take 10 mg by mouth daily., Disp: , Rfl:    megestrol (MEGACE) 40 MG tablet, Take 1 tablet (40 mg total) by mouth daily., Disp: 32 tablet, Rfl: 0   Prenatal Vit-Fe Fumarate-FA (PRENATAL MULTIVITAMIN) TABS tablet, Take 1 tablet by mouth daily at 12 noon., Disp: , Rfl:    Spacer/Aero-Holding Chambers DEVI, Use device as needed with inhalers, Disp: 1  each, Rfl: 0  Social History   Tobacco Use  Smoking Status Never   Passive exposure: Never  Smokeless Tobacco Never    No Known Allergies Objective:  There were no vitals filed for this visit. There is no height or weight on file to calculate BMI. Constitutional Well developed. Well nourished.  Vascular Dorsalis pedis pulses palpable bilaterally. Posterior tibial pulses palpable bilaterally. Capillary refill normal to all digits.  No cyanosis or clubbing noted. Pedal hair growth normal.  Neurologic Normal speech. Oriented to person, place, and time. Epicritic sensation to light touch grossly present bilaterally.  Dermatologic Nails well groomed and normal in appearance. No open wounds. No skin lesions.  Orthopedic: Normal joint ROM without pain or crepitus bilaterally. No visible deformities. Tender to palpation at the calcaneal tuber bilaterally. No pain with calcaneal squeeze bilaterally. Ankle ROM diminished range of motion bilaterally. Silfverskiold Test: positive bilaterally.   Radiographs: Taken and reviewed. No acute fractures or dislocations. No evidence of stress fracture.  Plantar heel spur absent. Posterior heel spur absent.  Pes planovalgus foot structure  Assessment:   1. Plantar fasciitis of left foot   2. Plantar fasciitis of right foot     Plan:  Patient was evaluated and treated and all questions answered.  Plantar Fasciitis, bilaterally - XR reviewed as above.  - Educated on icing and stretching. Instructions given.  -No further  injection delivered to the plantar fascia as they have not helped - DME: Cam boot immobilization as the shots have not helped - Pharmacologic management: None -I placed the order for physical therapy.  Order for physical therapy was sent.   No follow-ups on file.

## 2023-04-07 ENCOUNTER — Ambulatory Visit (INDEPENDENT_AMBULATORY_CARE_PROVIDER_SITE_OTHER): Payer: Medicaid Other | Admitting: Podiatry

## 2023-04-07 DIAGNOSIS — M722 Plantar fascial fibromatosis: Secondary | ICD-10-CM | POA: Diagnosis not present

## 2023-04-07 NOTE — Progress Notes (Signed)
Subjective:  Patient ID: Rebecca Carlson, female    DOB: 11-Sep-1981,  MRN: [id number]  Chief Complaint  Patient presents with   Plantar Fasciitis    Pt stated that things are okay     42 y.o. female presents with the above complaint.  Patient presents with bilateral Planter fasciitis.  Patient states pain is about the same.  She is managing it.  She did not get physical therapy she missed her appointment as she was sick   Review of Systems: Negative except as noted in the HPI. Denies N/V/F/Ch.  Past Medical History:  Diagnosis Date   Gestational diabetes    Medical history non-contributory    Previous cesarean section--subsequent VBAC x 3 12/03/2015   TOLAC consent signed 10/29/21    Current Outpatient Medications:    Accu-Chek Softclix Lancets lancets, Use four times daily as instructed., Disp: 100 each, Rfl: 12   albuterol (VENTOLIN HFA) 108 (90 Base) MCG/ACT inhaler, INHALE 2 PUFFS INTO THE LUNGS EVERY 6 HOURS AS NEEDED FOR WHEEZING OR SHORTNESS OF BREATH., Disp: 8.5 g, Rfl: 0   cyclobenzaprine (FLEXERIL) 5 MG tablet, Take 1 tablet (5 mg total) by mouth at bedtime as needed for muscle spasms., Disp: 15 tablet, Rfl: 0   etonogestrel (NEXPLANON) 68 MG IMPL implant, 1 each by Subdermal route once., Disp: , Rfl:    ferrous sulfate 325 (65 FE) MG EC tablet, Take 325 mg by mouth once. Every other day, Disp: , Rfl:    glucose blood test strip, Use as instructed, Disp: 100 each, Rfl: 12   ibuprofen (ADVIL) 600 MG tablet, Take 1 tablet (600 mg total) by mouth every 8 (eight) hours as needed., Disp: 30 tablet, Rfl: 0   loratadine (CLARITIN) 10 MG tablet, Take 10 mg by mouth daily., Disp: , Rfl:    megestrol (MEGACE) 40 MG tablet, Take 1 tablet (40 mg total) by mouth daily., Disp: 32 tablet, Rfl: 0   Prenatal Vit-Fe Fumarate-FA (PRENATAL MULTIVITAMIN) TABS tablet, Take 1 tablet by mouth daily at 12 noon., Disp: , Rfl:    Spacer/Aero-Holding Chambers DEVI, Use device as needed with  inhalers, Disp: 1 each, Rfl: 0  Social History   Tobacco Use  Smoking Status Never   Passive exposure: Never  Smokeless Tobacco Never    No Known Allergies Objective:  There were no vitals filed for this visit. There is no height or weight on file to calculate BMI. Constitutional Well developed. Well nourished.  Vascular Dorsalis pedis pulses palpable bilaterally. Posterior tibial pulses palpable bilaterally. Capillary refill normal to all digits.  No cyanosis or clubbing noted. Pedal hair growth normal.  Neurologic Normal speech. Oriented to person, place, and time. Epicritic sensation to light touch grossly present bilaterally.  Dermatologic Nails well groomed and normal in appearance. No open wounds. No skin lesions.  Orthopedic: Normal joint ROM without pain or crepitus bilaterally. No visible deformities. Tender to palpation at the calcaneal tuber bilaterally. No pain with calcaneal squeeze bilaterally. Ankle ROM diminished range of motion bilaterally. Silfverskiold Test: positive bilaterally.   Radiographs: Taken and reviewed. No acute fractures or dislocations. No evidence of stress fracture.  Plantar heel spur absent. Posterior heel spur absent.  Pes planovalgus foot structure  Assessment:   No diagnosis found.   Plan:  Patient was evaluated and treated and all questions answered.  Plantar Fasciitis, bilaterally - XR reviewed as above.  - Educated on icing and stretching. Instructions given.  -No further injection delivered to the plantar fascia as  they have not helped - DME: Cam boot immobilization as the shots have not helped - Pharmacologic management: None -Patient did not go to physical therapy she missed the phone call.  I will replace the order for physical therapy again.  She will try again.   No follow-ups on file.

## 2023-04-15 ENCOUNTER — Ambulatory Visit: Payer: Medicaid Other | Admitting: Internal Medicine

## 2023-04-21 ENCOUNTER — Ambulatory Visit (INDEPENDENT_AMBULATORY_CARE_PROVIDER_SITE_OTHER): Payer: Medicaid Other | Admitting: Certified Nurse Midwife

## 2023-04-21 ENCOUNTER — Other Ambulatory Visit (HOSPITAL_COMMUNITY)
Admission: RE | Admit: 2023-04-21 | Discharge: 2023-04-21 | Disposition: A | Discharge status: Home / Self Care | Payer: Medicaid Other | Source: Ambulatory Visit | Attending: Certified Nurse Midwife | Admitting: Certified Nurse Midwife

## 2023-04-21 ENCOUNTER — Other Ambulatory Visit: Payer: Self-pay

## 2023-04-21 ENCOUNTER — Encounter: Payer: Self-pay | Admitting: Certified Nurse Midwife

## 2023-04-21 VITALS — BP 122/88 | HR 74 | Wt 129.9 lb

## 2023-04-21 DIAGNOSIS — Z113 Encounter for screening for infections with a predominantly sexual mode of transmission: Secondary | ICD-10-CM | POA: Diagnosis not present

## 2023-04-21 DIAGNOSIS — Z1231 Encounter for screening mammogram for malignant neoplasm of breast: Secondary | ICD-10-CM

## 2023-04-21 DIAGNOSIS — Z124 Encounter for screening for malignant neoplasm of cervix: Secondary | ICD-10-CM | POA: Diagnosis present

## 2023-04-21 DIAGNOSIS — Z01419 Encounter for gynecological examination (general) (routine) without abnormal findings: Secondary | ICD-10-CM | POA: Diagnosis not present

## 2023-04-21 DIAGNOSIS — Z3046 Encounter for surveillance of implantable subdermal contraceptive: Secondary | ICD-10-CM

## 2023-04-21 DIAGNOSIS — Z3009 Encounter for other general counseling and advice on contraception: Secondary | ICD-10-CM

## 2023-04-21 MED ORDER — NORGESTIMATE-ETH ESTRADIOL 0.25-35 MG-MCG PO TABS: 1.0000 | ORAL_TABLET | Freq: Every day | ORAL | 11 refills | Status: DC

## 2023-04-21 MED ORDER — IBUPROFEN 800 MG PO TABS
800.0000 mg | ORAL_TABLET | Freq: Once | ORAL | Status: AC
  Administered 2023-04-21: 800 mg via ORAL

## 2023-04-21 NOTE — Progress Notes (Signed)
ANNUAL EXAM Patient name: Rebecca Carlson MRN [id number]  Date of birth: 24-Nov-1981 Chief Complaint:   Gynecologic Exam  History of Present Illness:   Jolanta Cabeza is a 42 y.o. [phone]  Middle Eastern/[place] African  female being seen today for a routine annual exam.  Current complaints: irregular bleeding due to her Nexplanon that is very disturbing to her as bleeding prevents her from taking part in her daily prayers (she is Muslim).  No LMP recorded. Patient has had an implant.   Upstream - 04/21/23 1335       Pregnancy Intention Screening   Does the patient want to become pregnant in the next year? No    Does the patient's partner want to become pregnant in the next year? No    Would the patient like to discuss contraceptive options today? Yes      Contraception Wrap Up   Current Method Hormonal Implant    End Method Oral Contraceptive    Contraception Counseling Provided Yes            The pregnancy intention screening data noted above was reviewed. Potential methods of contraception were discussed. The patient elected to proceed with Oral Contraceptive.   Last pap 04/04/2020. Results were: NILM w/ HRHPV negative. H/O abnormal pap: no Last mammogram: Never (age). Results were: N/A. Family h/o breast cancer: no Last colonoscopy: Never (age). Results were: N/A. Family h/o colorectal cancer: no     04/21/2023    1:51 PM 02/09/2023   10:36 AM 11/24/2022    2:24 PM 08/11/2022    9:02 AM 01/06/2022   12:47 PM  Depression screen PHQ 2/9  Decreased Interest 0 0 0 0 0  Down, Depressed, Hopeless 0 0 0 0 0  PHQ - 2 Score 0 0 0 0 0  Altered sleeping 0 0 1 0 0  Tired, decreased energy 0 0 1 1 0  Change in appetite 0 0 1 0 0  Feeling bad or failure about yourself  0 0 0 0 0  Trouble concentrating 0 0 0 0 0  Moving slowly or fidgety/restless 0 0 0 0 0  Suicidal thoughts 0 0 0 0 0  PHQ-9 Score 0 0 3 1 0        04/21/2023    1:51 PM 02/09/2023   10:36 AM  11/24/2022    2:24 PM 08/11/2022    9:03 AM  GAD 7 : Generalized Anxiety Score  Nervous, Anxious, on Edge 0 0 0 0  Control/stop worrying 0 0 0 0  Worry too much - different things 0 0 0 0  Trouble relaxing 0 0 0 0  Restless 0 0 0 0  Easily annoyed or irritable 0 0 0 0  Afraid - awful might happen 0 0 0 0  Total GAD 7 Score 0 0 0 0     Review of Systems:   Pertinent items are noted in HPI Denies any headaches, blurred vision, fatigue, shortness of breath, chest pain, abdominal pain, abnormal vaginal discharge/itching/odor/irritation, problems with periods, bowel movements, urination, or intercourse unless otherwise stated above. Pertinent History Reviewed:  Reviewed past medical,surgical, social and family history.  Reviewed problem list, medications and allergies. Physical Assessment:   Vitals:   04/21/23 1332  BP: 122/88  Pulse: 74  Weight: 129 lb 14.4 oz (58.9 kg)   Body mass index is 23.57 kg/m.   Physical Examination:  General appearance - well appearing, and in no distress Mental status - alert,  oriented to person, place, and time Psych:  She has a normal mood and affect Skin - warm and dry, normal color, no suspicious lesions noted Chest - effort normal, no problems with respiration noted Heart - normal rate and regular rhythm, warm and well perfused Neck:  midline trachea, no thyromegaly or nodules Breasts - breasts appear normal,  Abdomen - soft, nontender, nondistended, no masses or organomegaly Pelvic - VULVA: normal appearing vulva with female circumcision noted VAGINA: normal appearing vagina with normal color and discharge, no lesions  CERVIX: normal appearing cervix without discharge or lesions, no CMT Thin prep pap is done with HR HPV cotesting Extremities:  No swelling or varicosities noted  Chaperone present for exam  Nexplanon Removal Patient was given informed consent for removal of her Nexplanon.  Appropriate time out taken. Nexplanon site  identified.  Area prepped in usual sterile fashon. One ml of 1% lidocaine was used to anesthetize the area at the distal end of the implant. A small stab incision was made right beside the implant on the distal portion.  The Nexplanon rod was grasped using hemostats and removed without difficulty.  There was minimal blood loss. There were no complications.  A small amount of antibiotic ointment and steri-strips were applied over the small incision.  A pressure bandage was applied to reduce any bruising.  The patient tolerated the procedure well and was given post procedure instructions.  Patient is planning to use OCPs for contraception/attempt conception.  No results found for this or any previous visit (from the past 24 hour(s)).  Assessment & Plan:  1. Encounter for annual routine gynecological examination - Routine preventative health maintenance measures emphasized. - Mammogram: @ 40yo, or sooner if problems - Colonoscopy: @ 41yo, or sooner if problems  2. Encounter for screening mammogram for malignant neoplasm of breast - Mammogram scheduled - MM 3D SCREENING MAMMOGRAM BILATERAL BREAST; Future  3. Screening examination for STD (sexually transmitted disease) - Will collect tomorrow prior to her mammogram - RPR; Future - Hepatitis C Antibody; Future - Hepatitis B Surface AntiGEN; Future  4. Papanicolaou smear for cervical cancer screening - Cytology - PAP( [place]) - Will follow up results of pap smear and manage accordingly.  5. Nexplanon removal - ibuprofen (ADVIL) tablet 800 mg  6. Birth control counseling, oral contraceptives - Reviewed forms of birth control that will give her a reliable menstrual cycle with low risk for breakthrough bleeding. - Pt decided on OCPs, no history of HTN, migraines with aura or blood clots  Orders Placed This Encounter  Procedures   MM 3D SCREENING MAMMOGRAM BILATERAL BREAST   RPR   Hepatitis C Antibody   Hepatitis B Surface AntiGEN    Meds:  Meds ordered this encounter  Medications   ibuprofen (ADVIL) tablet 800 mg   norgestimate-ethinyl estradiol (ORTHO-CYCLEN) 0.25-35 MG-MCG tablet    Sig: Take 1 tablet by mouth daily.    Dispense:  28 tablet    Refill:  11   Follow-up: Return in about 1 year (around 04/20/2024) for ANN.  Bernerd Limbo, CNM 04/21/2023 8:36 PM

## 2023-04-21 NOTE — Progress Notes (Signed)
Continues to have breakthrough bleeding on Nexplanon. Describes as spotting every few days. Only takes Megace during periods of heavy bleeding. Desires removal of Nexplanon and initiation of either OCPs or Depo, leaning toward OCPs.   Will complete PAP and STD screening today. Mammogram scheduled for 04/22/23 at 10:30am (mobile unit)  Fleet Contras RN

## 2023-04-22 ENCOUNTER — Other Ambulatory Visit: Payer: Medicaid Other

## 2023-04-22 ENCOUNTER — Ambulatory Visit
Admission: RE | Admit: 2023-04-22 | Discharge: 2023-04-22 | Disposition: A | Discharge status: Home / Self Care | Payer: Medicaid Other | Source: Ambulatory Visit | Attending: Certified Nurse Midwife | Admitting: Certified Nurse Midwife

## 2023-04-22 DIAGNOSIS — Z113 Encounter for screening for infections with a predominantly sexual mode of transmission: Secondary | ICD-10-CM

## 2023-04-22 DIAGNOSIS — Z1231 Encounter for screening mammogram for malignant neoplasm of breast: Secondary | ICD-10-CM

## 2023-04-23 LAB — HEPATITIS C ANTIBODY: Hep C Virus Ab: NONREACTIVE

## 2023-04-23 LAB — RPR: RPR Ser Ql: NONREACTIVE

## 2023-04-23 LAB — HEPATITIS B SURFACE ANTIGEN: Hepatitis B Surface Ag: NEGATIVE

## 2023-04-27 ENCOUNTER — Other Ambulatory Visit: Payer: Self-pay | Admitting: Certified Nurse Midwife

## 2023-04-27 DIAGNOSIS — R928 Other abnormal and inconclusive findings on diagnostic imaging of breast: Secondary | ICD-10-CM

## 2023-04-29 ENCOUNTER — Ambulatory Visit: Payer: Medicaid Other | Attending: Podiatry

## 2023-04-29 DIAGNOSIS — M722 Plantar fascial fibromatosis: Secondary | ICD-10-CM | POA: Diagnosis not present

## 2023-04-29 DIAGNOSIS — M79671 Pain in right foot: Secondary | ICD-10-CM

## 2023-04-29 DIAGNOSIS — M79672 Pain in left foot: Secondary | ICD-10-CM | POA: Insufficient documentation

## 2023-04-29 LAB — CYTOLOGY - PAP
Chlamydia: NEGATIVE
Comment: NEGATIVE
Comment: NEGATIVE
Comment: NEGATIVE
Comment: NORMAL
Diagnosis: NEGATIVE
High risk HPV: NEGATIVE
Neisseria Gonorrhea: NEGATIVE
Trichomonas: NEGATIVE

## 2023-04-29 NOTE — Therapy (Signed)
OUTPATIENT PHYSICAL THERAPY LOWER EXTREMITY EVALUATION   Patient Name: Rebecca Carlson MRN: [id number] DOB:06-Dec-1981, 42 y.o., female Today's Date: 04/30/2023  END OF SESSION:  PT End of Session - 04/30/23 1429     Visit Number 1    PT Start Time 1045    PT Stop Time 1130    PT Time Calculation (min) 45 min    Activity Tolerance Patient tolerated treatment well    Behavior During Therapy [facility] for tasks assessed/performed             Past Medical History:  Diagnosis Date   Breakthrough bleeding on Nexplanon 01/05/2023   Gestational diabetes    Medical history non-contributory    Previous cesarean section--subsequent VBAC x 3 12/03/2015   TOLAC consent signed 10/29/21   Past Surgical History:  Procedure Laterality Date   CESAREAN SECTION     Patient Active Problem List   Diagnosis Date Noted   Melasma 01/01/2023   Inadequate dietary intake 02/26/2022   Language barrier 12/08/2021   Iron deficiency anemia 11/21/2021   History of gestational diabetes 11/21/2021   Previous cesarean section--subsequent VBAC x 5 12/03/2015   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   Patient reports to PT due to bilateral planter fasciitis symptoms. She states that her pain had gradual onset and is present around her heel but not underneath. She states that her pain is the worst in the morning or after a nap when she first stands up.    She was given an injection and shoe recommendation but feel that none of it is helpful. Patient does report  that she has had foot pain off and on since she was about 42 years old.   PERTINENT HISTORY: PMHx includes iron-deficiency anemia and chronic foot pain   PAIN:  Are you having pain? Yes: moderate-to-severe pain  Pain location: bilateral heel, bilateral achilles/calf  Pain description: unable to clarify at time of eval  Aggravating factors: standing  Relieving factors: walking around, 600mg  ibuprofen  PRECAUTIONS: None  WEIGHT BEARING RESTRICTIONS: No  FALLS:  Has patient fallen in last 6 months? 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   PLOF: Independent  PATIENT GOALS: Patient would like to painfree with household activities, childcare and work related tasks.    OBJECTIVE:   PATIENT SURVEYS:  FOTO 43 current, 59 predicted   COGNITION: Overall cognitive status: Within functional limits for tasks assessed     SENSATION: WFL   PALPATION: Moderate-to-severe tenderness to palpation along bilateral achilles tendon, gastroc/soleus muscle belly, and plantar surface of feet with slight passive DF   LOWER EXTREMITY ROM:  Active ROM Right eval Left eval  Ankle dorsiflexion 8 10  Ankle plantarflexion 25 30  Ankle inversion WNL WNL  Ankle eversion WNL WNL   (Blank rows = not tested)  LOWER EXTREMITY MMT: to be assessed at follow up   MMT  Right eval Left eval  Hip flexion    Hip extension    Hip abduction    Hip adduction    Hip internal rotation    Hip external rotation    Knee flexion    Knee extension    Ankle dorsiflexion    Ankle plantarflexion    Ankle inversion    Ankle eversion     (Blank rows = not tested)  GAIT: Distance walked: 50 ft Assistive device utilized: None Level of assistance: Complete Independence Comments: No  significant gait deviations noted at time of evaluation    [facility] Adult PT Treatment:                                                DATE: 04/29/2023  **Patient prefers a private treatment room**  Therapeutic Exercise: Seated Ankle Pumps on Table x 10  Seated Ankle Circles x 10    Seated Ankle Alphabet (instruction only)  Long Sitting Calf Stretch with Strap (instruction only)  Calf Stretch on Wall (instruction only)  Seated Plantar Fascia Mobilization with Small Ball/frozen water bottle (instruction only)   Manual Therapy: Manual calf stretch STM for bilateral plantar fascia and ankle PROM    PATIENT EDUCATION:  Education details: provided initial home program and discussed goals/focus of PT POC.  Person educated: Patient Education method: Chief Technology Officer Education comprehension: verbalized understanding, returned demonstration, and needs further education  HOME EXERCISE PROGRAM: Access Code: [email or web address] URL: [email or web address] Date: 04/30/2023 Prepared by: Mauri Reading  Exercises - Seated Ankle Pumps on Table  - 1 x daily - 7 x weekly - 1-2 sets - 10 reps - Seated Ankle Circles  - 1 x daily - 7 x weekly - 1-2 sets - 10 reps - Seated Ankle Alphabet  - 1 x daily - 7 x weekly - 1-2 sets - Long Sitting Calf Stretch with Strap  - 1 x daily - 7 x weekly - 2 sets - 20-30 sec hold - [place] Stretch on Wall  - 1 x daily - 7 x weekly - 2 sets - 10 reps - 20-30 sec hold - Seated Plantar Fascia Mobilization with Small Ball  - 1 x daily - 7 x weekly - 2-5 minutes hold  ASSESSMENT:  CLINICAL IMPRESSION: Patient is a 42 y.o. female who was seen today for physical therapy evaluation and treatment for bilateral foot pain, consistent with plantar fasciitis. She is demonstrated decreased ankle AROM, tenderness to palpation along plantar surface, achilles tendon and triceps surae muscle bellies. She has associated pain and difficulty with standing after prolonged  sitting/laying down, stair navigation and heavy lifting/carrying. Patient will benefit from skilled PT services at this time to address all associated deficits, promote conservative symptom management strategies and maximize overall QOL.     OBJECTIVE IMPAIRMENTS: decreased activity tolerance, difficulty walking, decreased ROM, and pain.   ACTIVITY LIMITATIONS: carrying, lifting, standing, stairs, locomotion level, and caring for others  PARTICIPATION LIMITATIONS: meal prep, cleaning, shopping, community activity, and occupation  PERSONAL FACTORS: Past/current experiences and Time since onset of injury/illness/exacerbation are also affecting patient's functional outcome.   REHAB POTENTIAL: Fair   CLINICAL DECISION MAKING: Evolving/moderate complexity  EVALUATION COMPLEXITY: Moderate   GOALS: Goals reviewed with patient? Yes  SHORT TERM GOALS: Target date: 05/28/2023   Patient will be compliant and independent with initial home program to address ankle  mobility and symptoms modulation throughout the day. Baseline: provided at initial visit.  Goal status: INITIAL   LONG TERM GOALS: Target date: 06/25/2023   Patient will report improved overall function with FOTO score of 55 or higher.  Baseline: 43 current, 59 predicted  Goal status: INITIAL  2.  Patient will demonstrate full pain-free AROM in both ankles.  Baseline: see objective findings  Goal status: INITIAL  3.  Patient will report ability to manage pain levels with prescribed home program after prolonged sitting/laying down and after prolonged activity.  Baseline: initial home program initiated at eval.  Goal status: INITIAL  4.  Patient will report ability to perform lifting/carrying as necessary for household cleaning and meal preparation without exacerbation of symptoms.  Baseline: unable to perform without pain.  Goal status: INITIAL  5.  Patient will demonstrate ability to squat to pick up various items from the  floor at least 10 times without increase in symptoms.  Baseline: moderate difficulty reported.  Goal status: INITIAL   PLAN:  PT FREQUENCY: 1-2x/week  PT DURATION: 8 weeks  PLANNED INTERVENTIONS: Therapeutic exercises, Therapeutic activity, Neuromuscular re-education, Balance training, Gait training, Patient/Family education, Self Care, Joint mobilization, Aquatic Therapy, Dry Needling, Electrical stimulation, Cryotherapy, Moist heat, Taping, Manual therapy, and Re-evaluation  PLAN FOR NEXT SESSION: review home program and updated as indicated; continue with manual therapy as indicated; modalities as needed; progress ankle/foot mobility and strengthening program. Pt education re: symptom modulation techniques   [employer] Authorization   Choose one: Rehabilitative  Standardized Assessment or Functional Outcome Tool: See Pain Assessment and Other FOTO  Score or Percent Disability: 43 current, 59 predicted   Body Parts Treated (Select each separately):  Left Ankle. Overall deficits/functional limitations for body part selected: moderate Right Ankle. Overall deficits/functional limitations for body part selected: moderate    If treatment provided at initial evaluation, no treatment charged due to lack of authorization.    Mauri Reading, PT, DPT  04/30/2023, 2:32 PM

## 2023-05-10 ENCOUNTER — Ambulatory Visit
Admission: RE | Admit: 2023-05-10 | Discharge: 2023-05-10 | Disposition: A | Discharge status: Home / Self Care | Payer: Medicaid Other | Source: Ambulatory Visit | Attending: Certified Nurse Midwife | Admitting: Certified Nurse Midwife

## 2023-05-10 ENCOUNTER — Other Ambulatory Visit: Payer: Self-pay | Admitting: Certified Nurse Midwife

## 2023-05-10 DIAGNOSIS — R928 Other abnormal and inconclusive findings on diagnostic imaging of breast: Secondary | ICD-10-CM

## 2023-05-10 DIAGNOSIS — N6310 Unspecified lump in the right breast, unspecified quadrant: Secondary | ICD-10-CM

## 2023-05-13 ENCOUNTER — Telehealth: Payer: Self-pay

## 2023-05-13 ENCOUNTER — Ambulatory Visit: Payer: Medicaid Other

## 2023-05-13 NOTE — Telephone Encounter (Signed)
Called patient via interpreter services using [employer] interpreter Husam #[phone]. LVM at 12:56pm due to missed appt this morning and reminding her of her next appt time.  - MJ

## 2023-05-13 NOTE — Therapy (Deleted)
OUTPATIENT PHYSICAL THERAPY LOWER EXTREMITY EVALUATION   Patient Name: Rebecca Carlson MRN: [id number] DOB:1981/08/29, 42 y.o., female Today's Date: 05/13/2023  END OF SESSION:    Past Medical History:  Diagnosis Date   Breakthrough bleeding on Nexplanon 01/05/2023   Gestational diabetes    Medical history non-contributory    Previous cesarean section--subsequent VBAC x 3 12/03/2015   TOLAC consent signed 10/29/21   Past Surgical History:  Procedure Laterality Date   CESAREAN SECTION     Patient Active Problem List   Diagnosis Date Noted   Melasma 01/01/2023   Inadequate dietary intake 02/26/2022   Language barrier 12/08/2021   Iron deficiency anemia 11/21/2021   History of gestational diabetes 11/21/2021   Previous cesarean section--subsequent VBAC x 5 12/03/2015   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   Patient reports to PT due to bilateral planter fasciitis symptoms. She states that her pain had gradual onset and is present around her heel but not underneath. She states that her pain is the worst in the morning or after a nap when she first stands up.    She was given an injection and shoe recommendation but feel that none of it is helpful. Patient does report that she has had foot pain off and on since she was about 42 years old.   PERTINENT HISTORY: PMHx includes iron-deficiency anemia and chronic foot pain   PAIN:  Are you having pain? Yes: moderate-to-severe pain  Pain location: bilateral heel, bilateral  achilles/calf  Pain description: unable to clarify at time of eval  Aggravating factors: standing  Relieving factors: walking around, 600mg  ibuprofen  PRECAUTIONS: None  WEIGHT BEARING RESTRICTIONS: No  FALLS:  Has patient fallen in last 6 months? 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   PLOF: Independent  PATIENT GOALS: Patient would like to painfree with household activities, childcare and work related tasks.    OBJECTIVE:   PATIENT SURVEYS:  FOTO 43 current, 59 predicted   COGNITION: Overall cognitive status: Within functional limits for tasks assessed     SENSATION: WFL   PALPATION: Moderate-to-severe tenderness to palpation along bilateral achilles tendon, gastroc/soleus muscle belly, and plantar surface of feet with slight passive DF   LOWER EXTREMITY ROM:  Active ROM Right eval Left eval  Ankle dorsiflexion 8 10  Ankle plantarflexion 25 30  Ankle inversion WNL WNL  Ankle eversion WNL WNL   (Blank rows = not tested)  LOWER EXTREMITY MMT: to be assessed at follow up   MMT Right eval Left eval  Hip flexion    Hip extension    Hip abduction    Hip adduction    Hip internal rotation    Hip external rotation    Knee flexion    Knee extension    Ankle dorsiflexion    Ankle plantarflexion    Ankle inversion    Ankle eversion     (  Blank rows = not tested)  GAIT: Distance walked: 50 ft Assistive device utilized: None Level of assistance: Complete Independence Comments: No significant gait deviations noted at time of evaluation    [facility] Adult PT Treatment:                                                DATE: 04/29/2023  **Patient prefers a private treatment room**  Therapeutic Exercise: Seated Ankle Pumps on Table x 10   Seated Ankle Circles x 10    Seated Ankle Alphabet (instruction only)  Long Sitting Calf Stretch with Strap (instruction only)  Calf Stretch on Wall (instruction only)  Seated Plantar Fascia Mobilization with Small Ball/frozen water bottle (instruction only)   Manual Therapy: Manual calf stretch STM for bilateral plantar fascia and ankle PROM    PATIENT EDUCATION:  Education details: provided initial home program and discussed goals/focus of PT POC.  Person educated: Patient Education method: Chief Technology Officer Education comprehension: verbalized understanding, returned demonstration, and needs further education  HOME EXERCISE PROGRAM: Access Code: [email or web address] URL: [email or web address] Date: 04/30/2023 Prepared by: Mauri Reading  Exercises - Seated Ankle Pumps on Table  - 1 x daily - 7 x weekly - 1-2 sets - 10 reps - Seated Ankle Circles  - 1 x daily - 7 x weekly - 1-2 sets - 10 reps - Seated Ankle Alphabet  - 1 x daily - 7 x weekly - 1-2 sets - Long Sitting Calf Stretch with Strap  - 1 x daily - 7 x weekly - 2 sets - 20-30 sec hold - Soleus Stretch on Wall  - 1 x daily - 7 x weekly - 2 sets - 10 reps - 20-30 sec hold - Seated Plantar Fascia Mobilization with Small Ball  - 1 x daily - 7 x weekly - 2-5 minutes hold  ASSESSMENT:  CLINICAL IMPRESSION: ***  Patient is a 42 y.o. female who was seen today for physical therapy evaluation and treatment for bilateral foot pain, consistent with plantar fasciitis. She is demonstrated decreased ankle AROM, tenderness to palpation along plantar surface, achilles tendon and triceps surae muscle bellies. She has associated pain and difficulty with standing after prolonged sitting/laying down, stair navigation and heavy lifting/carrying. Patient will benefit from skilled PT services at this time to address all associated deficits, promote conservative symptom management strategies and maximize overall QOL.     OBJECTIVE  IMPAIRMENTS: decreased activity tolerance, difficulty walking, decreased ROM, and pain.   ACTIVITY LIMITATIONS: carrying, lifting, standing, stairs, locomotion level, and caring for others  PARTICIPATION LIMITATIONS: meal prep, cleaning, shopping, community activity, and occupation  PERSONAL FACTORS: Past/current experiences and Time since onset of injury/illness/exacerbation are also affecting patient's functional outcome.   REHAB POTENTIAL: Fair   CLINICAL DECISION MAKING: Evolving/moderate complexity  EVALUATION COMPLEXITY: Moderate   GOALS: Goals reviewed with patient? Yes  SHORT TERM GOALS: Target date: 05/28/2023   Patient will be compliant and independent with initial home program to address ankle mobility and symptoms modulation throughout the day. Baseline: provided at initial visit.  Goal status: INITIAL   LONG TERM GOALS: Target date: 06/25/2023   Patient will report improved overall function with FOTO score of 55 or higher.  Baseline: 43 current, 59 predicted  Goal status: INITIAL  2.  Patient will demonstrate full pain-free AROM in both ankles.  Baseline: see objective findings  Goal status: INITIAL  3.  Patient will report ability to manage pain levels with prescribed home program after prolonged sitting/laying down and after prolonged activity.  Baseline: initial home program initiated at eval.  Goal status: INITIAL  4.  Patient will report ability to perform lifting/carrying as necessary for household cleaning and meal preparation without exacerbation of symptoms.  Baseline: unable to perform without pain.  Goal status: INITIAL  5.  Patient will demonstrate ability to squat to pick up various items from the floor at least 10 times without increase in symptoms.  Baseline: moderate difficulty reported.  Goal status: INITIAL   PLAN:  PT FREQUENCY: 1-2x/week  PT DURATION: 8 weeks  PLANNED INTERVENTIONS: Therapeutic exercises, Therapeutic activity,  Neuromuscular re-education, Balance training, Gait training, Patient/Family education, Self Care, Joint mobilization, Aquatic Therapy, Dry Needling, Electrical stimulation, Cryotherapy, Moist heat, Taping, Manual therapy, and Re-evaluation  PLAN FOR NEXT SESSION: review home program and updated as indicated; continue with manual therapy as indicated; modalities as needed; progress ankle/foot mobility and strengthening program. Pt education re: symptom modulation techniques     Mauri Reading, PT, DPT  05/13/2023, 11:14 AM

## 2023-05-20 ENCOUNTER — Ambulatory Visit: Payer: Medicaid Other

## 2023-05-20 ENCOUNTER — Telehealth: Payer: Self-pay

## 2023-05-20 NOTE — Telephone Encounter (Signed)
Spoke with patient via [employer] interpreter [place] [phone]. Patient states that she has been sick and unable to call because her phone was not working. She can no longer make morning appointments due to change in her work schedule and confirms that she will be able to attend appointment on 06/08/23 @ 5pm. She confirms understanding of our cancellation policy regarding scheduling 1 visit at a time due to 2 no-show, no-calls as of 05/20/23. - MJ

## 2023-05-26 ENCOUNTER — Ambulatory Visit: Payer: Medicaid Other | Admitting: Podiatry

## 2023-05-27 ENCOUNTER — Ambulatory Visit: Payer: Medicaid Other

## 2023-06-08 ENCOUNTER — Ambulatory Visit: Payer: Medicaid Other | Attending: Podiatry

## 2023-06-09 ENCOUNTER — Telehealth: Payer: Self-pay

## 2023-06-09 NOTE — Telephone Encounter (Signed)
LVM via [employer] interpreter [phone] d/t missed appt yesterday. Informed patient that yesterday was her 3rd no call/no show and she will be d/c from skilled PT services per cancellation policy. Any remaining appts will be cancelled. We will require a new referral if she wants to return to PT. - MJ

## 2023-07-22 ENCOUNTER — Ambulatory Visit: Payer: Medicaid Other | Admitting: Internal Medicine

## 2023-09-13 ENCOUNTER — Emergency Department (HOSPITAL_COMMUNITY)
Admission: EM | Admit: 2023-09-13 | Discharge: 2023-09-13 | Discharge status: Against Medical Advice | Payer: Medicaid Other

## 2023-09-13 ENCOUNTER — Encounter (HOSPITAL_COMMUNITY): Payer: Self-pay | Admitting: *Deleted

## 2023-09-13 ENCOUNTER — Other Ambulatory Visit: Payer: Self-pay

## 2023-09-13 ENCOUNTER — Ambulatory Visit (HOSPITAL_COMMUNITY)
Admission: EM | Admit: 2023-09-13 | Discharge: 2023-09-13 | Disposition: A | Discharge status: Home / Self Care | Payer: Medicaid Other | Attending: Emergency Medicine | Admitting: Emergency Medicine

## 2023-09-13 ENCOUNTER — Ambulatory Visit: Payer: Self-pay

## 2023-09-13 DIAGNOSIS — J4521 Mild intermittent asthma with (acute) exacerbation: Secondary | ICD-10-CM

## 2023-09-13 DIAGNOSIS — R051 Acute cough: Secondary | ICD-10-CM

## 2023-09-13 DIAGNOSIS — J302 Other seasonal allergic rhinitis: Secondary | ICD-10-CM

## 2023-09-13 MED ORDER — BENZONATATE 100 MG PO CAPS: 100.0000 mg | ORAL_CAPSULE | Freq: Three times a day (TID) | ORAL | 0 refills | Status: DC

## 2023-09-13 MED ORDER — ALBUTEROL SULFATE HFA 108 (90 BASE) MCG/ACT IN AERS
2.0000 | INHALATION_SPRAY | Freq: Four times a day (QID) | RESPIRATORY_TRACT | 0 refills | Status: DC | PRN
Start: 2023-09-13 — End: 2023-10-07

## 2023-09-13 MED ORDER — PROMETHAZINE-DM 6.25-15 MG/5ML PO SYRP: 5.0000 mL | ORAL_SOLUTION | Freq: Every evening | ORAL | 0 refills | Status: DC | PRN

## 2023-09-13 MED ORDER — CETIRIZINE HCL 10 MG PO TABS: 10.0000 mg | ORAL_TABLET | Freq: Every day | ORAL | 0 refills | Status: DC

## 2023-09-13 NOTE — ED Triage Notes (Signed)
Pt has been using a friends Inhaler.

## 2023-09-13 NOTE — Telephone Encounter (Signed)
[facility] in ed at [facility]

## 2023-09-13 NOTE — ED Triage Notes (Signed)
Pt reports having a runny nose ,a lot of sneezing and SHOB for one month.

## 2023-09-13 NOTE — Telephone Encounter (Signed)
Chief Complaint: Sore throat Symptoms: runny nose, sneezing, SOB w/a lot of sneezing Frequency: Onset x 1 month Pertinent Negatives: Patient denies other symptoms Disposition: [] ED /[] Urgent Care (no appt availability in office) / [] Appointment(In office/virtual)/ []  [facility]/ [] Home Care/ [] Refused Recommended Disposition /[x] [facility]/ []  Follow-up with PCP Additional Notes: Advised no availability in the office, advised [facility] location/hours today. She says she will go. She says she has been using her son's inhaler, advised to ask the provider at the [facility] for a refill of any medications needed. She says she will need an interpreter on the laptop not in person. Advised once she's there, they will make sure to have an interpreter for her and it will not be in person, she verbalized understanding.   Reason for Disposition  [1] Sore throat with cough/cold symptoms AND [2] present > 5 days  Answer Assessment - Initial Assessment Questions 1. ONSET: "When did the throat start hurting?" (Hours or days ago)      1 month ago 2. SEVERITY: "How bad is the sore throat?" (Scale 1-10; mild, moderate or severe)   - MILD (1-3):  Doesn't interfere with eating or normal activities.   - MODERATE (4-7): Interferes with eating some solids and normal activities.   - SEVERE (8-10):  Excruciating pain, interferes with most normal activities.   - SEVERE WITH DYSPHAGIA (10): Can't swallow liquids, drooling.     8 in the mornings, afternoons feels well 3.  VIRAL SYMPTOMS: "Are there any symptoms of a cold, such as a runny nose, cough, hoarse voice or red eyes?"      Runny nose w/allergies  5. FEVER: "Do you have a fever?" If Yes, ask: "What is your temperature, how was it measured, and when did it start?"     No 6. OTHER SYMPTOMS: "Do you have any other symptoms?" (e.g., difficulty breathing, headache, rash)     Running nose, SOB off and on with sneezing alot  Protocols  used: Sore Throat-A-AH

## 2023-09-13 NOTE — Discharge Instructions (Addendum)
Use albuterol inhaler as needed for wheezing or shortness of breath.  You can take a daily to take Zyrtec at night if it makes you drowsy.  You can take Tessalon during the day for cough and Promethazine DM cough syrup at night.  Cough syrup will make you drowsy so do not work or drive while taking this.  If you develop trouble breathing, chest pain, fevers unrelieved with medication please seek immediate medical treatment in the emergency department.  Follow-up with primary care doctor or return here as needed.  ?????? ???? ??????? ????????? ??? ?????? ????? ?????? ?? ??? ??????.  ????? ????? Zyrtec ?????? ?? ????? ??? ??? ????? ???? ???????.  ????? ????? ??????? ????? ?????? ????? ?????? ????? ?????? ?????????? ?? ?? ?? ?????.  ???? ?????? ??? ????? ???? ???????? ??? ?? ???? ?? ???? ????? ????? ??? ??????.  ??? ??? ????? ?? ????? ?? ??????? ?? ??? ?? ?????? ?? ??? ?? ???? ???????? ??????? ????? ??? ?????? ????? ?????? ?? ??? ???????.  ???????? ?? ???? ??????? ??????? ?? ?????? ??? ??? ??????. aistakhdim Sherrlyn Hock 'albutirul hasab alhajat lieilaj alsafir 'aw diq altanafusi. [place] tan[place]ul Zyrtec ywmyan fi allayl 'iidha kan yajealuk tasheur bialnueasi. [place] tanawul tysalun 'athna' alnahar lieilaj alsueal washarab alsueal brumithazin di 'iim fi alliyli. sharab alsueal sawf yajealuk tasheur bialnueasi, lidha la taemal 'aw taqud 'athna' tanawul hadha aldawa'i. 'iidha kunt tueani min sueubat fi altanafusi, 'aw 'alam fi alsadri, 'aw himaa lam tahda biaistikhdam aldawa'i, fayurjaa talab aleilaj altibiyi alfawrii fi qism altawarii. almutabieat mae tabib alrieayat al'awaliat 'aw aleawdat huna hasab alhajati.

## 2023-09-13 NOTE — ED Provider Notes (Signed)
[facility]    CSN: [id number] Arrival date & time: 09/13/23  1805      History   Chief Complaint Chief Complaint  Patient presents with   Shortness of Breath   Nasal Congestion    HPI Rebecca Carlson is a 42 y.o. female.   Patient presents with cough, mild shortness of breath, sneezing, runny nose x 1 month.  Denies chest pain, fever, abdominal pain, nausea, vomiting, diarrhea.  Reports using son's albuterol inhaler and allergy medicine due to running out of her own, with some relief.     Shortness of Breath Associated symptoms: cough   Associated symptoms: no abdominal pain, no chest pain, no fever, no headaches, no sore throat, no vomiting and no wheezing     Past Medical History:  Diagnosis Date   Breakthrough bleeding on Nexplanon 01/05/2023   Gestational diabetes    Medical history non-contributory    Previous cesarean section--subsequent VBAC x 3 12/03/2015   TOLAC consent signed 10/29/21    Patient Active Problem List   Diagnosis Date Noted   Melasma 01/01/2023   Inadequate dietary intake 02/26/2022   Language barrier 12/08/2021   Iron deficiency anemia 11/21/2021   History of gestational diabetes 11/21/2021   Previous cesarean section--subsequent VBAC x 5 12/03/2015   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 Authorizing Provider  benzonatate (TESSALON) 100 MG capsule Take 1 capsule (100 mg total) by mouth every 8 (eight) hours. 09/13/23  Yes Wynonia Lawman A, NP  cetirizine (ZYRTEC ALLERGY) 10 MG tablet Take 1 tablet (10 mg total) by mouth daily. 09/13/23  Yes Wynonia Lawman A, NP   promethazine-dextromethorphan (PROMETHAZINE-DM) 6.25-15 MG/5ML syrup Take 5 mLs by mouth at bedtime as needed for cough. 09/13/23  Yes Susann Givens, Carlton Buskey A, NP  albuterol (VENTOLIN HFA) 108 (90 Base) MCG/ACT inhaler Inhale 2 puffs into the lungs every 6 (six) hours as needed for wheezing or shortness of breath. 09/13/23   Wynonia Lawman A, NP  norgestimate-ethinyl estradiol (ORTHO-CYCLEN) 0.25-35 MG-MCG tablet Take 1 tablet by mouth daily. 04/21/23   Bernerd Limbo, CNM  promethazine (PHENERGAN) 25 MG tablet Take 0.5-1 tablets (12.5-25 mg total) by mouth every 6 (six) hours as needed for refractory nausea / vomiting (headache). 11/07/15 08/02/19  Gavin Pound, MD    Family History Family History  Problem Relation Age of Onset   Diabetes Sister    Hypertension Sister    Diabetes Sister    Valvular heart disease Sister    Thyroid disease Sister    Diabetes Brother    Breast cancer Neg Hx     Social History Social History   Tobacco Use   Smoking status: Never    Passive exposure: Never   Smokeless tobacco: Never  Vaping Use   Vaping status: Never Used  Substance Use Topics   Alcohol use: No   Drug  use: No     Allergies   Patient has no known allergies.   Review of Systems Review of Systems  Constitutional:  Negative for chills, fatigue and fever.  HENT:  Positive for congestion, rhinorrhea and sneezing. Negative for sinus pressure, sinus pain and sore throat.   Respiratory:  Positive for cough and shortness of breath. Negative for chest tightness and wheezing.   Cardiovascular:  Negative for chest pain.  Gastrointestinal:  Negative for abdominal pain, diarrhea, nausea and vomiting.  Neurological:  Negative for dizziness, weakness, light-headedness and headaches.     Physical Exam Triage Vital Signs ED Triage Vitals  Encounter Vitals Group     BP 09/13/23 1925 130/84     Systolic BP Percentile --      Diastolic BP Percentile --      Pulse Rate 09/13/23 1925 74      Resp 09/13/23 1925 20     Temp 09/13/23 1925 98.4 F (36.9 C)     Temp src --      SpO2 09/13/23 1925 98 %     Weight --      Height --      Head Circumference --      Peak Flow --      Pain Score 09/13/23 1920 8     Pain Loc --      Pain Education --      Exclude from Growth Chart --    No data found.  Updated Vital Signs BP 130/84   Pulse 74   Temp 98.4 F (36.9 C)   Resp 20   LMP 09/13/2023   SpO2 98%   Visual Acuity Right Eye Distance:   Left Eye Distance:   Bilateral Distance:    Right Eye Near:   Left Eye Near:    Bilateral Near:     Physical Exam Vitals and nursing note reviewed.  Constitutional:      General: She is awake. She is not in acute distress.    Appearance: Normal appearance. She is well-developed and well-groomed. She is not ill-appearing, toxic-appearing or diaphoretic.  HENT:     Right Ear: Tympanic membrane, ear canal and external ear normal.     Left Ear: Tympanic membrane, ear canal and external ear normal.     Nose: Congestion and rhinorrhea present.     Mouth/Throat:     Mouth: Mucous membranes are moist.     Pharynx: Posterior oropharyngeal erythema and postnasal drip present. No pharyngeal swelling or oropharyngeal exudate.     Tonsils: No tonsillar exudate.  Cardiovascular:     Rate and Rhythm: Normal rate.     Heart sounds: Normal heart sounds.  Pulmonary:     Effort: Pulmonary effort is normal. No tachypnea or respiratory distress.     Breath sounds: Normal breath sounds. No decreased breath sounds or wheezing.  Musculoskeletal:        General: Normal range of motion.     Cervical back: Normal range of motion.  Skin:    General: Skin is warm and dry.  Neurological:     Mental Status: She is alert.  Psychiatric:        Behavior: Behavior is cooperative.      UC Treatments / Results  Labs (all labs ordered are listed, but only abnormal results are displayed) Labs Reviewed - No data to display  EKG   Radiology No  results found.  Procedures Procedures (including critical care time)  Medications Ordered in UC Medications -  No data to display  Initial Impression / Assessment and Plan / UC Course  I have reviewed the triage vital signs and the nursing notes.  Pertinent labs & imaging results that were available during my care of the patient were reviewed by me and considered in my medical decision making (see chart for details).     Patient presented with cough, mild shortness of breath, sneezing, runny nose x 1 month.  Denies chest pain, fever, abdominal pain, nausea, vomiting, diarrhea.  Reports using son's albuterol inhaler and allergy medicine due to running out of her with some relief.  Upon assessment patient has mild erythema to oropharynx, congestion and rhinorrhea are present.  Lungs clear bilaterally to auscultation.  Prescribed albuterol inhaler as needed for shortness of breath and wheezing, and Zyrtec to take daily for congestion.  Prescribed Tessalon and promethazine for cough.  Discussed return, follow-up, emergency department precautions. Final Clinical Impressions(s) / UC Diagnoses   Final diagnoses:  Acute cough  Mild intermittent asthma with acute exacerbation     Discharge Instructions      Use albuterol inhaler as needed for wheezing or shortness of breath.  You can take a daily to take Zyrtec at night if it makes you drowsy.  You can take Tessalon during the day for cough and Promethazine DM cough syrup at night.  Cough syrup will make you drowsy so do not work or drive while taking this.  If you develop trouble breathing, chest pain, fevers unrelieved with medication please seek immediate medical treatment in the emergency department.  Follow-up with primary care doctor or return here as needed.  ?????? ???? ??????? ????????? ??? ?????? ????? ?????? ?? ??? ??????.  ????? ????? Zyrtec ?????? ?? ????? ??? ??? ????? ???? ???????.  ????? ????? ??????? ????? ?????? ????? ??????  ????? ?????? ?????????? ?? ?? ?? ?????.  ???? ?????? ??? ????? ???? ???????? ??? ?? ???? ?? ???? ????? ????? ??? ??????.  ??? ??? ????? ?? ????? ?? ??????? ?? ??? ?? ?????? ?? ??? ?? ???? ???????? ??????? ????? ??? ?????? ????? ?????? ?? ??? ???????.  ???????? ?? ???? ??????? ??????? ?? ?????? ??? ??? ??????. aistakhdim Sherrlyn Hock 'albutirul hasab alhajat lieilaj alsafir 'aw diq altanafusi. [place]ul Zyrtec ywmyan fi allayl 'iidha kan yajealuk tasheur bialnueasi. [place] tanawul tysalun 'athna' alnahar lieilaj alsueal washarab alsueal brumithazin di 'iim fi alliyli. sharab alsueal sawf yajealuk tasheur bialnueasi, lidha la taemal 'aw taqud 'athna' tanawul hadha aldawa'i. 'iidha kunt tueani min sueubat fi altanafusi, 'aw 'alam fi alsadri, 'aw himaa lam tahda biaistikhdam aldawa'i, fayurjaa talab aleilaj altibiyi alfawrii fi qism altawarii. almutabieat mae tabib alrieayat al'awaliat 'aw aleawdat huna hasab alhajati.    ED Prescriptions     Medication Sig Dispense Auth. Provider   albuterol (VENTOLIN HFA) 108 (90 Base) MCG/ACT inhaler Inhale 2 puffs into the lungs every 6 (six) hours as needed for wheezing or shortness of breath. 8.5 g Wynonia Lawman A, NP   cetirizine (ZYRTEC ALLERGY) 10 MG tablet Take 1 tablet (10 mg total) by mouth daily. 30 tablet Wynonia Lawman A, NP   benzonatate (TESSALON) 100 MG capsule Take 1 capsule (100 mg total) by mouth every 8 (eight) hours. 21 capsule Wynonia Lawman A, NP   promethazine-dextromethorphan (PROMETHAZINE-DM) 6.25-15 MG/5ML syrup Take 5 mLs by mouth at bedtime as needed for cough. 118 mL Wynonia Lawman A, NP      PDMP not reviewed this encounter.   Wynonia Lawman A, NP 09/13/23 2018

## 2023-10-07 ENCOUNTER — Encounter: Payer: Self-pay | Admitting: Internal Medicine

## 2023-10-07 ENCOUNTER — Other Ambulatory Visit: Payer: Self-pay

## 2023-10-07 ENCOUNTER — Ambulatory Visit: Payer: Medicaid Other | Attending: Internal Medicine | Admitting: Internal Medicine

## 2023-10-07 VITALS — BP 108/71 | HR 66 | Ht 62.0 in | Wt 132.0 lb

## 2023-10-07 DIAGNOSIS — Z23 Encounter for immunization: Secondary | ICD-10-CM

## 2023-10-07 DIAGNOSIS — J302 Other seasonal allergic rhinitis: Secondary | ICD-10-CM

## 2023-10-07 DIAGNOSIS — J4531 Mild persistent asthma with (acute) exacerbation: Secondary | ICD-10-CM | POA: Diagnosis not present

## 2023-10-07 MED ORDER — ALBUTEROL SULFATE HFA 108 (90 BASE) MCG/ACT IN AERS
2.0000 | INHALATION_SPRAY | Freq: Four times a day (QID) | RESPIRATORY_TRACT | 0 refills | Status: DC | PRN
Start: 2023-10-07 — End: 2024-01-18

## 2023-10-07 MED ORDER — FLUTICASONE PROPIONATE 50 MCG/ACT NA SUSP: NASAL | 1 refills | Status: DC

## 2023-10-07 MED ORDER — PREDNISONE 20 MG PO TABS
ORAL_TABLET | ORAL | 0 refills | Status: DC
Start: 2023-10-07 — End: 2024-09-15

## 2023-10-07 MED ORDER — BUDESONIDE-FORMOTEROL FUMARATE 80-4.5 MCG/ACT IN AERO
2.0000 | INHALATION_SPRAY | Freq: Two times a day (BID) | RESPIRATORY_TRACT | 3 refills | Status: DC
Start: 2023-10-07 — End: 2024-09-15

## 2023-10-07 MED ORDER — CETIRIZINE HCL 10 MG PO TABS: 10.0000 mg | ORAL_TABLET | Freq: Every day | ORAL | 0 refills | Status: DC

## 2023-10-07 NOTE — Patient Instructions (Signed)
????? ??? ???????? Asthma, Adult  ????? ????? ???? ????? ????? (?????) ???? ????? ?????? ????? ???? ??????? ???????? ? ?? ?? ????? ??????? ???? ????? ????????? ??????? ?? ??????? ??????. ????? ????? ???? ?????? ??????? ??????? ??? ??????? ???????? ??????? ?????????. ????? ????? ?????? ???? ????? ????? ????? ????? ?? ????? ????? ?????? ??????? ?????? ???? ?????? ???? ?????? ??? ?????? ??????? ?? ???? ??? ?????? (????)? ???? ??????? ?????? ?? ?????. ??? ???? ???????? ??????? ????? ?? ????? ??????? ???????? ??????. ?? ???? ?? ????? ?????? ?? ????? ??????. ?????? ????? ?????? ?? ????? ?? ??? ??????? ??????? ???????? ??????? ???? ???? ?? ???? ??? ??????. ?? ???? ???? ????? ?????? ?????? ??????? ??? ??? ???? ??????? ?????? ??? ?????? ?? ????? ??? ?????? ??? ????? ??????? ??????. ??? ????? ?? ????? ??? ????? ?????? ????? ???? ??? ?? ???? ??? ??????? ?????? ???????. ?? ????? ??? ??????? ?????? ?? ????? ?????? ?????? ?????? (?????) ??????? ??? ?? ????? ?????? ??????? ?? ??? ?????. ?? ??????? ???? ???? ?? ???? ??? ???? ???? ?????? ???? ????? ????? ???? ?? ???? ???? ????? ?? ???? ?? ??? ????? ?????. ???? ???????? ????? ?? ??? ????. ????? ???????? ??????? ????? ?? ???:  ?????? ???????? ???????? ??? ?????? ???????? ???? (????? ???????? ?? ?????) ????????? ???????? ?????????? ????? ??????? ??????? ??????? ???????? ??????????.  ???? ???????.  ???? ????? ??????? ??????.  ?????? ?? ???? ????? ?????????? ??????? ??? ?????? ?? ????? ????.  ????? ????? ??? ???????? ?? ?????? ????.  ????? ?????? ?? ??????? ???? ???? ???????? ??? ?????????? ?????? ????? ??????? ??????? ???????? ?? ????? (?????? ?????).  ??? ???????? ?? ?????? ??? ?????? (GERD). ?? ?????? ?????? ?? ???????? ?? ???? ??????? ??? ?????? ???? ?????? ????? ?????? ?? ??? ??? ??????? ???? ??????? ???? ?? ????? ?? ??? ????. ???? ???????? ???????? ??????? ?????:  ????.  ????? ?????? (??? ??????).  ?????? ?????? ?? ?????? ?? ?????? ??????.  ??? ?????.  ?????  (???????) ?? ???? ?????? ?? ??????.  ????? ??? ??? ?????.  ?????? ?????? ??? ???? ?????????. ??? ????? ??? ??????? ????? ??? ?????? ???:  ????? ?????? ???????? ??????? ??????.  ???????? ???? ?? ????: ? ???? ????? ????? ?????? ???? ?????? ?? ?????. ? ???????? ????????. ? ???? ?????? ??? ??????? ??????? ???????. ??? ?????? ??? ??????? ?? ???? ???? ????? ???? ??????? ???? ???? ??????? ??? ??????? ???????? ?????? ??????. ?????? ?????? ?????:  ?????? ??? ?????? ????? ????? ?????? ???.  ??????? ?????????. ????? ????? ?????? ????????? ????? ?????? ?????? ????? ???????? ??? ??? ???????: ? ????? ??????. ???? ??? ??????? ??? ??? ????? ???? ????? ?????. ????????? ?????? ??????. ? ????? ??????? ?? ??????? ????? ???????. ???? ??? ??????? ??? ??????? ?????? ?? ????? ?????. ???????? ??? ??????? ????? ???? ?????? ?????? ??????? ?? ???????.  ??????? ????? ????? ???: ? ????? ????????? ??? ?????? ??????????? ??? ???? ????? ????? ????? ?? ?????? ????????. ? ????? ??????? (??????????? ????????). ???? ??????? ????? ??? ?????? ?? ?????? ???????.  ??????? ?????? ??????. ??? ????? ?????? ??? ??? ??? ?? ???? ??????? ???????.  ??? ??? ??? ?????. ??? ??? ????? ???? ?????? ?????? ????? ????? ???? ???????. ????? ??? ????? ???: ? ????? ?????? ????? ?????? ??????. ? ??????? ??? ??? ??? ????? ??????? ???? ??? ????? ???????. ? ??????? ??? ??????? ???? ???? ???? ???? ??????. ???? ???? ???? ???? ???? ?????? ??? ????? ??? ???????. ??????? ??? ?????? ??? ?????? ?????. ???? ??? ????????? ?? ??????:  ????? ??????? ???? ????? ????? ???? ?? ??? ???? ???? ??? ?? ?????? ?? ???? ??????? ?????? ????? ??.  ???? ??? ???? ???? ???????? ????? ??????? ???? ??????? ?????? ??????? ??? ??? ?? ??? ?????? ?????????? ????????? ??????.  ?????? ????? ???? ???? ????? ???????.  ???? ??? ??? ????? ?????? ?? ??????? ??????? ?? ?? ????? ?????? ?????.  ?? ???? ??? ???? ???? ???????? ?? ?????. ???? ????? ??????? ?????? ?? ??????? ???????:  ??????? ??  ?????? ?? ??? ?????? ?? ?????? ???? ????? ??? ????? ?? ????? ???????.  ??????? ???? ???????? ???? ??????? ?????? ??? ????? ?? ????? ?? ?????? ?? ????? ??????.  ??? ???? ??????? ???? ???? ??????? ????? ??  2-3 ???? ????????.  ????? ????? ???? ?????? ?? ???? ??? 50-79% ????? ???? ???? ??? ??? ????? ??? ????? ???? ????.  ????? ???? ??????. ???? ???????? ????? ?? ??????? ???????:  ????? ????? ????? ??? ?????? ?????? ?? ????? ???? ?????.  ???? ???? ?? ?????? ?? ????? ?????? ?? ??? ?????? ???? ???? ???? ??????.  ????? ?? ????? ?? ????? ?????? ?? ?????? ?? ??????.  ??????? ???? ?? ??? ?? ?????.  ?????? ???? ????? ??? ???? ?? ?????.  ???? ????? ?? ??? ?????? ?? ????? ????? ????.  ???? ??????? ?? ?????? ?? ???????.  ????? ????? ???? ?????? ??? ?? 50% ????? ???? ???? ??.  ???? ?????? ?????? ?????? ???? ?????. ??? ???? ??? ??????? ????? ??? ???? ???? ?????. ???? ???????? ??? ?????. ???? ???? 911.  ?? ????? ?? ??? ???? ??????? ????? ?? ??.  ?? ??? ??????? ????? ??? ????????. ????  ????? ????? ???? ????? ????? (?????) ???? ????? ?????? ???? ???? ??????? ???????? ????. ??? ???? ?? ???? ????? ?????? ?????? ???? ????? ????? ?? ????? ????? ?????? ?????? ??????? ???? ?? ?????? ???? ?? ?????.  ?? ???? ???? ????? ?????? ?? ?????? ???? ????? ??????? ?????? ????? ??????? ???? ?? ????? ?? ??????? ???? ???????? ?? ????? ??????.  ???? ?? ??? ???? ????? ???? ???????? ???? ???? ??????? ??????? ?????? ??.  ?????? ????? ?????? ?? ????? ?? ??? ??????? ??????? ???????? ??????? ???? ???? ?? ???? ??? ??????. ???? ??? ???????? ????? ??? ??? ???? ???? ??? ??? ?????? ?????? ?????? ??????? ????????. ??? ????? ?? ??? ????????? ?? ???? ?????? ????????? ???? ?????? ???? ??????? ??????. ???? ?? ?????? ??? ????? ???? ?? ???? ?? ???? ??????? ??????.? Document Revised: 09/30/2021 Document Reviewed: 09/30/2021 Elsevier Patient Education  2024 [employer].

## 2023-10-07 NOTE — Progress Notes (Signed)
Patient ID: Rebecca Carlson, female    DOB: [birth year]  MRN: [id number]  CC: Follow-up (Follow-up. /On-going asthma symtpoms, coughing, SOB - medicine not helping/Concern of R & L eye, watery eyes, vision loss X2 mo/Yes to flu vax)   Subjective: Rebecca Carlson is a 42 y.o. female who presents for f/u UC visit. Her concerns today include:   [employer] Language interpreter used during this encounter. #Rebecca Carlson [id number]  Pt seen at [facility] 09/13/23 with cough, shortness of breath, sneezing and rhinorrhea.  Diagnosed with mild intermittent asthma with acute exacerbation.  Prescribed albuterol inhaler, Tessalon Perles and generic Zyrtec. Since then, she does not feel any better.  Reports symptoms have been going on for about 2 months.  Reports cough that is mostly dry but nonproductive, productive of white phlegm.  This has been associated with shortness of breath and cough especially at nights.  She has been using the albuterol inhaler twice during the night.  Other symptoms includes sneezing, nasal congestion, watery itchy eyes and itchy throat.  She denies any fever.  No recent international travel.  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 21 capsule 0   norgestimate-ethinyl estradiol (ORTHO-CYCLEN) 0.25-35 MG-MCG tablet Take 1 tablet by mouth daily. 28 tablet 11   promethazine-dextromethorphan (PROMETHAZINE-DM) 6.25-15 MG/5ML syrup Take 5 mLs by mouth at bedtime as needed for cough. 118 mL 0   [DISCONTINUED] promethazine (PHENERGAN) 25  MG tablet Take 0.5-1 tablets (12.5-25 mg total) by mouth every 6 (six) hours as needed for refractory nausea / vomiting (headache). 10 tablet 0   No current facility-administered medications on file prior to visit.    No Known Allergies  Social History   Socioeconomic History   Marital status: Married    Spouse name: Not on file   Number of children: 5   Years of education: high school   Highest education level: High school graduate  Occupational History   Occupation: Stay at home mom   Occupation: House wife  Tobacco Use   Smoking status: Never    Passive exposure: Never   Smokeless tobacco: Never  Vaping Use   Vaping status: Never Used  Substance and Sexual Activity   Alcohol use: No   Drug use: No   Sexual activity: Yes    Birth control/protection: Implant    Comment: nexplanon placed 01/29/22  Other Topics Concern   Not on file  Social History Narrative   ** Merged History Encounter **       ** Data from: 11/24/22 Enc Dept: [facility]   ** Merged History Encounter **           ** Data from: 08/11/22 Enc Dept: [facility]   Can read in English, speaks a little english   Social Determinants of Health   Financial Resource Strain: Low Risk  (10/07/2023)   Overall Financial Resource Strain (CARDIA)    Difficulty of Paying Living Expenses: Not hard at all  Food Insecurity: No Food Insecurity (10/07/2023)   Hunger Vital  Sign    Worried About Programme researcher, broadcasting/film/video in the Last Year: Never true    Ran Out of Food in the Last Year: Never true  Transportation Needs: No Transportation Needs (04/21/2023)   PRAPARE - Administrator, Civil Service (Medical): No    Lack of Transportation (Non-Medical): No  Physical Activity: Inactive (10/07/2023)   Exercise Vital Sign    Days of Exercise per Week: 0 days    Minutes of Exercise per Session: 0 min  Stress: No Stress Concern Present (10/07/2023)   [employer] of Occupational Health -  Occupational Stress Questionnaire    Feeling of Stress : Not at all  Social Connections: Moderately Isolated (10/07/2023)   Social Connection and Isolation Panel [NHANES]    Frequency of Communication with Friends and Family: Twice a week    Frequency of Social Gatherings with Friends and Family: Twice a week    Attends Religious Services: Never    Database administrator or Organizations: No    Attends Banker Meetings: Never    Marital Status: Married  Catering manager Violence: Not At Risk (10/07/2023)   Humiliation, Afraid, Rape, and Kick questionnaire    Fear of Current or Ex-Partner: No    Emotionally Abused: No    Physically Abused: No    Sexually Abused: No    Family History  Problem Relation Age of Onset   Diabetes Sister    Hypertension Sister    Diabetes Sister    Valvular heart disease Sister    Thyroid disease Sister    Diabetes Brother    Breast cancer Neg Hx     Past Surgical History:  Procedure Laterality Date   CESAREAN SECTION      ROS: Review of Systems Negative except as stated above  PHYSICAL EXAM: BP 108/71 (BP Location: Left Arm, Patient Position: Sitting, Cuff Size: Normal)   Pulse 66   Ht 5\' 2"  (1.575 m)   Wt 132 lb (59.9 kg)   LMP 09/13/2023   SpO2 98%   BMI 24.14 kg/m   Physical Exam  General appearance - alert, well appearing, and in no distress.  Patient with mild audible congestion and small bouts of dry cough spells. Mental status - normal mood, behavior, speech, dress, motor activity, and thought processes Nose -nasal mucosa is moist with moderate enlargement of nasal turbinates on the right side. Mouth - mucous membranes moist, pharynx normal without lesions Neck - supple, no significant adenopathy Chest -rhonchi and wheezing bilaterally with good air entry. Heart - normal rate, regular rhythm, normal S1, S2, no murmurs, rubs, clicks or gallops      Latest Ref Rng & Units 02/09/2023   11:47 AM 01/22/2020    4:29 PM  11/07/2015   11:25 AM  CMP  Glucose 70 - 99 mg/dL 616  073  89   BUN 6 - 24 mg/dL 10  6  <5   Creatinine 0.57 - 1.00 mg/dL 7.10  6.26  9.48   Sodium 134 - 144 mmol/L 136  137  135   Potassium 3.5 - 5.2 mmol/L 4.5  3.8  3.9   Chloride 96 - 106 mmol/L 102  105  108   CO2 20 - 29 mmol/L 21  22  20    Calcium 8.7 - 10.2 mg/dL 9.6  9.2  8.6   Total Protein 6.0 - 8.5 g/dL 6.7  6.7  5.6   Total Bilirubin 0.0 - 1.2 mg/dL 0.3  0.5  0.5   Alkaline Phos 44 - 121 IU/L 82  48  103   AST 0 - 40 IU/L 13  16  18    ALT 0 - 32 IU/L 6  13  10     Lipid Panel  No results found for: "CHOL", "TRIG", "HDL", "CHOLHDL", "VLDL", "LDLCALC", "LDLDIRECT"  CBC    Component Value Date/Time   WBC 7.9 02/09/2023 1147   WBC 7.3 12/01/2022 1120   RBC 4.10 02/09/2023 1147   RBC 4.40 12/01/2022 1120   HGB 11.9 02/09/2023 1147   HCT 35.6 02/09/2023 1147   PLT 349 02/09/2023 1147   MCV 87 02/09/2023 1147   MCH 29.0 02/09/2023 1147   MCH 28.6 12/01/2022 1120   MCHC 33.4 02/09/2023 1147   MCHC 32.8 12/01/2022 1120   RDW 11.8 02/09/2023 1147   LYMPHSABS 2.2 09/29/2021 1624   MONOABS 0.5 11/07/2015 1125   EOSABS 0.8 (H) 09/29/2021 1624   BASOSABS 0.0 09/29/2021 1624    ASSESSMENT AND PLAN: 1. Mild persistent asthma with acute exacerbation I think patient has allergies with underlying asthma. She had albuterol inhaler with her today and I had her show me how she was using it.  She has poor technique.  I showed her proper technique in using inhaler. Continue albuterol inhaler as needed.  I recommend that we add Symbicort for maintenance.  Advised to wash mouth out after each use. Given short course of prednisone.  - budesonide-formoterol (SYMBICORT) 80-4.5 MCG/ACT inhaler; Inhale 2 puffs into the lungs 2 (two) times daily.  Dispense: 1 each; Refill: 3 - albuterol (VENTOLIN HFA) 108 (90 Base) MCG/ACT inhaler; Inhale 2 puffs into the lungs every 6 (six) hours as needed for wheezing or shortness of breath.   Dispense: 8.5 g; Refill: 0 - predniSONE (DELTASONE) 20 MG tablet; 1 tab Po daily x 4 days then 1/2 tab daily x 4 days  Dispense: 6 tablet; Refill: 0  2. Seasonal allergies - albuterol (VENTOLIN HFA) 108 (90 Base) MCG/ACT inhaler; Inhale 2 puffs into the lungs every 6 (six) hours as needed for wheezing or shortness of breath.  Dispense: 8.5 g; Refill: 0 - fluticasone (FLONASE) 50 MCG/ACT nasal spray; 1 spray daily each nostril x 1 wk then PRN  Dispense: 16 g; Refill: 1 - cetirizine (ZYRTEC ALLERGY) 10 MG tablet; Take 1 tablet (10 mg total) by mouth daily.  Dispense: 60 tablet; Refill: 0  3. Encounter for immunization - Flu vaccine trivalent PF, 6mos and older(Flulaval,Afluria,Fluarix,Fluzone)       Patient was given the opportunity to ask questions.  Patient verbalized understanding of the plan and was able to repeat key elements of the plan.   This documentation was completed using Paediatric nurse.  Any transcriptional errors are unintentional.  Orders Placed This Encounter  Procedures   Flu vaccine trivalent PF, 6mos and older(Flulaval,Afluria,Fluarix,Fluzone)     Requested Prescriptions   Signed Prescriptions Disp Refills   budesonide-formoterol (SYMBICORT) 80-4.5 MCG/ACT inhaler 1 each 3    Sig: Inhale 2 puffs into the lungs 2 (two) times daily.   albuterol (VENTOLIN HFA) 108 (90 Base) MCG/ACT inhaler 8.5 g 0    Sig: Inhale 2 puffs into the lungs every 6 (six) hours as needed for wheezing or shortness of breath.   predniSONE (DELTASONE) 20 MG tablet 6 tablet 0    Sig: 1 tab Po daily x 4 days then 1/2 tab daily x 4 days   fluticasone (FLONASE) 50 MCG/ACT nasal spray 16 g 1  Sig: 1 spray daily each nostril x 1 wk then PRN   cetirizine (ZYRTEC ALLERGY) 10 MG tablet 60 tablet 0    Sig: Take 1 tablet (10 mg total) by mouth daily.    Return if symptoms worsen or fail to improve.  Jonah Blue, MD, FACP

## 2023-11-10 ENCOUNTER — Ambulatory Visit
Admission: RE | Admit: 2023-11-10 | Discharge: 2023-11-10 | Disposition: A | Discharge status: Home / Self Care | Payer: Medicaid Other | Source: Ambulatory Visit | Attending: Certified Nurse Midwife | Admitting: Certified Nurse Midwife

## 2023-11-10 ENCOUNTER — Other Ambulatory Visit: Payer: Self-pay | Admitting: Certified Nurse Midwife

## 2023-11-10 DIAGNOSIS — R928 Other abnormal and inconclusive findings on diagnostic imaging of breast: Secondary | ICD-10-CM

## 2023-11-10 DIAGNOSIS — N6310 Unspecified lump in the right breast, unspecified quadrant: Secondary | ICD-10-CM

## 2024-01-05 ENCOUNTER — Ambulatory Visit: Payer: Medicaid Other | Admitting: Dermatology

## 2024-01-18 ENCOUNTER — Encounter: Payer: Self-pay | Admitting: Internal Medicine

## 2024-01-18 ENCOUNTER — Ambulatory Visit: Payer: Medicaid Other | Attending: Internal Medicine | Admitting: Internal Medicine

## 2024-01-18 VITALS — BP 107/63 | HR 67 | Wt 133.0 lb

## 2024-01-18 DIAGNOSIS — J4531 Mild persistent asthma with (acute) exacerbation: Secondary | ICD-10-CM

## 2024-01-18 DIAGNOSIS — R29898 Other symptoms and signs involving the musculoskeletal system: Secondary | ICD-10-CM | POA: Diagnosis not present

## 2024-01-18 DIAGNOSIS — J4530 Mild persistent asthma, uncomplicated: Secondary | ICD-10-CM

## 2024-01-18 DIAGNOSIS — M5416 Radiculopathy, lumbar region: Secondary | ICD-10-CM | POA: Diagnosis not present

## 2024-01-18 DIAGNOSIS — R109 Unspecified abdominal pain: Secondary | ICD-10-CM

## 2024-01-18 DIAGNOSIS — K029 Dental caries, unspecified: Secondary | ICD-10-CM

## 2024-01-18 DIAGNOSIS — J302 Other seasonal allergic rhinitis: Secondary | ICD-10-CM

## 2024-01-18 LAB — POCT URINALYSIS DIP (CLINITEK)
Bilirubin, UA: NEGATIVE
Glucose, UA: NEGATIVE mg/dL
Ketones, POC UA: NEGATIVE mg/dL
Nitrite, UA: NEGATIVE
POC PROTEIN,UA: NEGATIVE
Spec Grav, UA: 1.015 (ref 1.010–1.025)
Urobilinogen, UA: 0.2 U/dL
pH, UA: 6.5 (ref 5.0–8.0)

## 2024-01-18 MED ORDER — ALBUTEROL SULFATE HFA 108 (90 BASE) MCG/ACT IN AERS
2.0000 | INHALATION_SPRAY | Freq: Four times a day (QID) | RESPIRATORY_TRACT | 0 refills | Status: DC | PRN
Start: 2024-01-18 — End: 2024-02-11

## 2024-01-18 MED ORDER — CETIRIZINE HCL 10 MG PO TABS: 10.0000 mg | ORAL_TABLET | Freq: Every day | ORAL | 1 refills | Status: DC

## 2024-01-18 MED ORDER — CYCLOBENZAPRINE HCL 5 MG PO TABS
5.0000 mg | ORAL_TABLET | Freq: Every day | ORAL | 0 refills | Status: AC | PRN
Start: 2024-01-18 — End: ?

## 2024-01-18 NOTE — Progress Notes (Unsigned)
Patient ID: Rebecca Carlson, female    DOB: 08/25/1981  MRN: [id number]  CC: jaw problems , Dental Pain, and Back Pain   Subjective: Rebecca Carlson is a 43 y.o. female who presents for chronic ds management. Her concerns today include:  Hx of IDA, asthma  AMN Language interpreter used during this encounter. #WNUU [id number]; [id number] Haya  Pt c/o dental issues C/o jaw locking whenever she yawns x 2 mth; only on RT side. When she yawns looks like the jaw moves to LT side.  No pain with chewing.  Also reports pain in teeth RT upper jaw.  Has cavity in that area. Has not seen a dentist in 5 yrs.   Also c/o pain in RT lower back from mid flank down Would get flare up intermittently for past few mths but constant for past 2 wks. Pain radiates down back of RT leg.  Endorses some numbness/tingling/tightness in the leg that last 5-10 mins 1-3x/day No incontinence of bowel/bladder.  Not sure of any initiating factors but does a lot of standing at work.  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  Not a lot of lifting. 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 21 capsule 0   budesonide-formoterol (SYMBICORT) 80-4.5 MCG/ACT inhaler Inhale 2 puffs into the lungs 2 (two) times daily. 1 each 3   fluticasone (FLONASE) 50 MCG/ACT nasal spray 1 spray daily each nostril x 1 wk then PRN 16 g 1   norgestimate-ethinyl estradiol (ORTHO-CYCLEN)  0.25-35 MG-MCG tablet Take 1 tablet by mouth daily. 28 tablet 11   predniSONE (DELTASONE) 20 MG tablet 1 tab Po daily x 4 days then 1/2 tab daily x 4 days 6 tablet 0   promethazine-dextromethorphan (PROMETHAZINE-DM) 6.25-15 MG/5ML syrup Take 5 mLs by mouth at bedtime as needed for cough. 118 mL 0   [DISCONTINUED] promethazine (PHENERGAN) 25 MG tablet Take 0.5-1 tablets (12.5-25 mg total) by mouth every 6 (six) hours as needed for refractory nausea / vomiting (headache). 10 tablet 0   No current facility-administered medications on file prior to visit.    No Known Allergies  Social History   Socioeconomic History   Marital status: Married    Spouse name: Not on file   Number of children: 5   Years of education: high school   Highest education level: High school graduate  Occupational History   Occupation: Stay at home mom   Occupation: House wife  Tobacco Use   Smoking status: Never    Passive exposure: Never   Smokeless tobacco: Never  Vaping Use   Vaping status: Never Used  Substance and Sexual Activity   Alcohol use: No   Drug use: No   Sexual activity: Yes    Birth control/protection: Implant    Comment: nexplanon placed 01/29/22  Other Topics Concern   Not on file  Social History Narrative   ** Merged History Encounter **       **  Data from: 11/24/22 Enc Dept: [facility]   ** Merged History Encounter **           ** Data from: 08/11/22 Enc Dept: [facility]   Can read in English, speaks a little english   Social Drivers of Health   Financial Resource Strain: Low Risk  (10/07/2023)   Overall Financial Resource Strain (CARDIA)    Difficulty of Paying Living Expenses: Not hard at all  Food Insecurity: No Food Insecurity (10/07/2023)   Hunger Vital Sign    Worried About Running Out of Food in the Last Year: Never true    Ran Out of Food in the Last Year: Never true  Transportation Needs: No Transportation Needs (04/21/2023)   PRAPARE -  Administrator, Civil Service (Medical): No    Lack of Transportation (Non-Medical): No  Physical Activity: Inactive (10/07/2023)   Exercise Vital Sign    Days of Exercise per Week: 0 days    Minutes of Exercise per Session: 0 min  Stress: No Stress Concern Present (10/07/2023)   [employer] of Occupational Health - Occupational Stress Questionnaire    Feeling of Stress : Not at all  Social Connections: Moderately Isolated (10/07/2023)   Social Connection and Isolation Panel [NHANES]    Frequency of Communication with Friends and Family: Twice a week    Frequency of Social Gatherings with Friends and Family: Twice a week    Attends Religious Services: Never    Database administrator or Organizations: No    Attends Banker Meetings: Never    Marital Status: Married  Catering manager Violence: Not At Risk (10/07/2023)   Humiliation, Afraid, Rape, and Kick questionnaire    Fear of Current or Ex-Partner: No    Emotionally Abused: No    Physically Abused: No    Sexually Abused: No    Family History  Problem Relation Age of Onset   Diabetes Sister    Hypertension Sister    Diabetes Sister    Valvular heart disease Sister    Thyroid disease Sister    Diabetes Brother    Breast cancer Neg Hx     Past Surgical History:  Procedure Laterality Date   CESAREAN SECTION      ROS: Review of Systems Negative except as stated above  PHYSICAL EXAM: BP 107/63 (BP Location: Left Arm, Patient Position: Sitting, Cuff Size: Normal)   Pulse 67   Wt 133 lb (60.3 kg)   SpO2 98%   BMI 24.33 kg/m   Physical Exam  General appearance - alert, well appearing, and in no distress Mental status - normal mood, behavior, speech, dress, motor activity, and thought processes Mouth - dental cavity noted wisdom tooth RT upper jaw.  No swelling or redness of the gum in this area. Gum not tender to touch. +clicking of TMJ BL with opening/closing of jaw. Neurological -  power 5/5 BL LE. Gross sensation intact both LE. Gait normal. Knee jerk reflexes brisk Musculoskeletal - no tenderness on palpation of thoracic/lumbar spine.  Mild tenderness on palpation of RT lower lumbar region  Results for orders placed or performed in visit on 01/18/24  POCT URINALYSIS DIP (CLINITEK)   Collection Time: 01/18/24  4:17 PM  Result Value Ref Range   Color, UA yellow yellow   Clarity, UA clear clear   Glucose, UA negative negative mg/dL   Bilirubin, UA negative negative   Ketones, POC UA negative negative mg/dL  Spec Grav, UA 1.015 1.010 - 1.025   Blood, UA trace-intact (A) negative   pH, UA 6.5 5.0 - 8.0   POC PROTEIN,UA negative negative, trace   Urobilinogen, UA 0.2 0.2 or 1.0 E.U./dL   Nitrite, UA Negative Negative   Leukocytes, UA Trace (A) Negative       Latest Ref Rng & Units 02/09/2023   11:47 AM 01/22/2020    4:29 PM 11/07/2015   11:25 AM  CMP  Glucose 70 - 99 mg/dL 161  096  89   BUN 6 - 24 mg/dL 10  6  <5   Creatinine 0.57 - 1.00 mg/dL 0.45  4.09  8.11   Sodium 134 - 144 mmol/L 136  137  135   Potassium 3.5 - 5.2 mmol/L 4.5  3.8  3.9   Chloride 96 - 106 mmol/L 102  105  108   CO2 20 - 29 mmol/L 21  22  20    Calcium 8.7 - 10.2 mg/dL 9.6  9.2  8.6   Total Protein 6.0 - 8.5 g/dL 6.7  6.7  5.6   Total Bilirubin 0.0 - 1.2 mg/dL 0.3  0.5  0.5   Alkaline Phos 44 - 121 IU/L 82  48  103   AST 0 - 40 IU/L 13  16  18    ALT 0 - 32 IU/L 6  13  10     Lipid Panel  No results found for: "CHOL", "TRIG", "HDL", "CHOLHDL", "VLDL", "LDLCALC", "LDLDIRECT"  CBC    Component Value Date/Time   WBC 7.9 02/09/2023 1147   WBC 7.3 12/01/2022 1120   RBC 4.10 02/09/2023 1147   RBC 4.40 12/01/2022 1120   HGB 11.9 02/09/2023 1147   HCT 35.6 02/09/2023 1147   PLT 349 02/09/2023 1147   MCV 87 02/09/2023 1147   MCH 29.0 02/09/2023 1147   MCH 28.6 12/01/2022 1120   MCHC 33.4 02/09/2023 1147   MCHC 32.8 12/01/2022 1120   RDW 11.8 02/09/2023 1147   LYMPHSABS 2.2  09/29/2021 1624   MONOABS 0.5 11/07/2015 1125   EOSABS 0.8 (H) 09/29/2021 1624   BASOSABS 0.0 09/29/2021 1624    ASSESSMENT AND PLAN: 1. Lumbar radiculopathy, acute (Primary) -I recommend to avoid heavy lifting, pushing or pulling for the next 2 to 3 weeks. Recommend that she increase the ibuprofen from 200 mg to 800 mg (4 tabs) 3 times a day as needed.  Advised to take with food.  We will also give a trial of muscle relaxant Flexeril.  Advised that the medication can cause drowsiness and not to take it when she has to work or drive. -Recommend use of heating pad -f/u in 3-4 wksif no improvement or any worsening of symptoms - cyclobenzaprine (FLEXERIL) 5 MG tablet; Take 1 tablet (5 mg total) by mouth daily as needed for muscle spasms.  Dispense: 30 tablet; Refill: 0  2. Flank pain Not c/w UTI - POCT URINALYSIS DIP (CLINITEK)  3. TMJ click -Patient reports symptom is noticeable only when she yawns.  Denies any pain with chewing.  Recommend alternating hot and cold compresses if she does develop pain.  Avoid excessive clicking of the jaw or grinding of the teeth. Discuss when she sees dentist.  4. Dental cavities Recommend seeing a dentist ASAP. No signs of dental infection/abscess at this time  5. Seasonal allergies - albuterol (VENTOLIN HFA) 108 (90 Base) MCG/ACT inhaler; Inhale 2 puffs into the lungs every 6 (six) hours as needed for wheezing or shortness of  breath.  Dispense: 8.5 g; Refill: 0 - cetirizine (ZYRTEC ALLERGY) 10 MG tablet; Take 1 tablet (10 mg total) by mouth daily.  Dispense: 60 tablet; Refill: 1  6. Mild persistent asthma without complication - albuterol (VENTOLIN HFA) 108 (90 Base) MCG/ACT inhaler; Inhale 2 puffs into the lungs every 6 (six) hours as needed for wheezing or shortness of breath.  Dispense: 8.5 g; Refill: 0    Patient was given the opportunity to ask questions.  Patient verbalized understanding of the plan and was able to repeat key elements of the  plan.   This documentation was completed using Paediatric nurse.  Any transcriptional errors are unintentional.  Orders Placed This Encounter  Procedures   POCT URINALYSIS DIP (CLINITEK)     Requested Prescriptions   Signed Prescriptions Disp Refills   albuterol (VENTOLIN HFA) 108 (90 Base) MCG/ACT inhaler 8.5 g 0    Sig: Inhale 2 puffs into the lungs every 6 (six) hours as needed for wheezing or shortness of breath.   cetirizine (ZYRTEC ALLERGY) 10 MG tablet 60 tablet 1    Sig: Take 1 tablet (10 mg total) by mouth daily.   cyclobenzaprine (FLEXERIL) 5 MG tablet 30 tablet 0    Sig: Take 1 tablet (5 mg total) by mouth daily as needed for muscle spasms.    Return if symptoms worsen or fail to improve.  Jonah Blue, MD, FACP

## 2024-01-18 NOTE — Progress Notes (Unsigned)
Rebecca Carlson [phone]

## 2024-01-19 ENCOUNTER — Encounter: Payer: Self-pay | Admitting: Internal Medicine

## 2024-01-26 ENCOUNTER — Ambulatory Visit: Payer: Medicaid Other | Admitting: Dermatology

## 2024-01-26 DIAGNOSIS — L818 Other specified disorders of pigmentation: Secondary | ICD-10-CM | POA: Diagnosis not present

## 2024-01-26 DIAGNOSIS — L811 Chloasma: Secondary | ICD-10-CM | POA: Diagnosis not present

## 2024-01-26 MED ORDER — HYDROQUINONE 4 % EX CREA: TOPICAL_CREAM | Freq: Two times a day (BID) | CUTANEOUS | 2 refills | Status: DC

## 2024-01-26 NOTE — Progress Notes (Signed)
 New Patient Visit   Subjective  Rebecca Carlson is a 43 y.o. female who presents for the following: x2 years ago postpartum noticed white spots on R leg some brown spots on face sometimes itchy.  Pt is accompanied with interpreter, Bedor  The patient has spots, moles and lesions to be evaluated, some may be new or changing and the patient may have concern these could be cancer.   The following portions of the chart were reviewed this encounter and updated as appropriate: medications, allergies, medical history  Review of Systems:  No other skin or systemic complaints except as noted in HPI or Assessment and Plan.  Objective  Well appearing patient in no apparent distress; mood and affect are within normal limits.   A focused examination was performed of the following areas: Legs, face  Relevant exam findings are noted in the Assessment and Plan.           Assessment & Plan   Idiopathic Guttate Hypomelanosis (IGH)  Exam: scattered small hypopigmented macules at legs  IGH is a benign chronic condition of sun-exposed skin, most commonly affecting the arms and legs. Cause is unknown but it can be a genetic trait. It is not related to vitiligo. There is no treatment. Recommend photoprotection and regular use of spf 30 or higher sunscreen which may help prevent the development of more white spots.  Treatment Plan:  Benign, observe.     MELASMA Exam: reticulated hyperpigmented patches at face, cheeks- see photos  Chronic and persistent condition with duration or expected duration over one year. Condition is bothersome/symptomatic for patient. Currently flared.   Melasma is a chronic; persistent condition of hyperpigmented patches generally on the face, worse in summer due to higher UV exposure.    Heredity; thyroid disease; sun exposure; pregnancy; birth control pills; epilepsy medication and darker skin may predispose to Melasma.   Recommendations include: -  Sun avoidance and daily broad spectrum (UVA/UVB) tinted mineral sunscreen SPF 30+, with Zinc or Titanium Dioxide. - Rx topical bleaching creams (i.e. hydroquinone ) is a common treatment but should not be used long term.  Hydroquinones may be mixed with retinoids; vitamin C; steroids; Kojic Acid. - Alastin A-luminate, retinoids, vitamin C, topical tranexamic acid , glycolic acid and kojic acid can be used for brightening while on break from hydroquinone  - Rx Azelaic Acid  is also a treatment option that is safe for pregnancy (Category B). - OTC Heliocare can be helpful in control and prevention. - Oral Rx with Tranexamic Acid  250 mg - 650 mg po daily can be used for moderate to severe cases especially during summer (contraindications include pregnancy; lactation; hx of PE; hx of DVT; clotting disorder; heart disease; anticoagulant use and upcoming long trips)   - Chemical peels (would need multiple for best result).  - Lasers and Microdermabrasion may also be helpful adjunct treatments.  Treatment Plan: - Use 4% Hydroquinone  cream just to dark spots on face, twice daily morning and night up to 3 months. Patient provided with GoodRx coupon. Reviewed risk of permanent dark spots (ochronosis) if hydroquinone  is used longer than 3 months.  - Start daily broad spectrum (UVA/UVB) tinted mineral sunscreen SPF 30+, with Zinc or Titanium Dioxide. Samples given   Return in about 3 months (around 04/24/2024) for w/ Dr. Jackquline for melasma .  I, Rebecca Carlson, CMA, am acting as scribe for Rebecca Jackquline, MD .   Documentation: I have reviewed the above documentation for accuracy and completeness, and I agree with  the above.  Rebecca Rattler, MD

## 2024-01-26 NOTE — Patient Instructions (Signed)

## 2024-02-11 ENCOUNTER — Other Ambulatory Visit: Payer: Self-pay

## 2024-02-11 ENCOUNTER — Other Ambulatory Visit: Payer: Self-pay | Admitting: Internal Medicine

## 2024-02-11 DIAGNOSIS — J4530 Mild persistent asthma, uncomplicated: Secondary | ICD-10-CM

## 2024-02-11 DIAGNOSIS — J302 Other seasonal allergic rhinitis: Secondary | ICD-10-CM

## 2024-02-11 MED ORDER — ALBUTEROL SULFATE HFA 108 (90 BASE) MCG/ACT IN AERS
2.0000 | INHALATION_SPRAY | Freq: Four times a day (QID) | RESPIRATORY_TRACT | 2 refills | Status: DC | PRN
Start: 2024-02-11 — End: 2024-05-17

## 2024-03-03 ENCOUNTER — Other Ambulatory Visit: Payer: Self-pay | Admitting: Dermatology

## 2024-03-29 ENCOUNTER — Other Ambulatory Visit: Payer: Self-pay | Admitting: Certified Nurse Midwife

## 2024-03-29 DIAGNOSIS — Z3009 Encounter for other general counseling and advice on contraception: Secondary | ICD-10-CM

## 2024-04-02 ENCOUNTER — Other Ambulatory Visit: Payer: Self-pay | Admitting: Certified Nurse Midwife

## 2024-04-02 DIAGNOSIS — Z3009 Encounter for other general counseling and advice on contraception: Secondary | ICD-10-CM

## 2024-04-10 ENCOUNTER — Other Ambulatory Visit: Payer: Self-pay

## 2024-04-10 DIAGNOSIS — Z3009 Encounter for other general counseling and advice on contraception: Secondary | ICD-10-CM

## 2024-04-10 MED ORDER — NORGESTIMATE-ETH ESTRADIOL 0.25-35 MG-MCG PO TABS
1.0000 | ORAL_TABLET | Freq: Every day | ORAL | 2 refills | Status: DC
Start: 2024-04-10 — End: 2024-04-10

## 2024-04-10 MED ORDER — NORGESTIMATE-ETH ESTRADIOL 0.25-35 MG-MCG PO TABS
1.0000 | ORAL_TABLET | Freq: Every day | ORAL | 2 refills | Status: DC
Start: 2024-04-10 — End: 2024-05-17

## 2024-04-10 NOTE — Addendum Note (Signed)
 Addended by: Fonda Hymen on: 04/10/2024 03:38 PM   Modules accepted: Orders

## 2024-04-10 NOTE — Progress Notes (Signed)
 Patient into front office to request refill of birth control. 3 month supply sent. Front office to schedule annual exam.   Rebecca Carlson 04/10/24

## 2024-04-14 ENCOUNTER — Other Ambulatory Visit: Payer: Self-pay | Admitting: Lactation Services

## 2024-04-14 NOTE — Progress Notes (Signed)
 Opened in error

## 2024-04-25 ENCOUNTER — Ambulatory Visit: Payer: Medicaid Other | Admitting: Dermatology

## 2024-04-25 DIAGNOSIS — L811 Chloasma: Secondary | ICD-10-CM

## 2024-04-25 DIAGNOSIS — L700 Acne vulgaris: Secondary | ICD-10-CM

## 2024-04-25 DIAGNOSIS — Z79899 Other long term (current) drug therapy: Secondary | ICD-10-CM

## 2024-04-25 MED ORDER — TRETINOIN 0.025 % EX CREA: TOPICAL_CREAM | CUTANEOUS | 0 refills | Status: AC

## 2024-04-25 MED ORDER — AZELAIC ACID 15 % EX GEL: CUTANEOUS | 2 refills | Status: AC

## 2024-04-25 NOTE — Patient Instructions (Addendum)
 STOP hydroquinone  4% cream for now  For acne: Use Azelaic acid 15% gel use twice a day (morning and night)  Use Tretinoin 0.025% cream use pea-sized amount nightly, may cause dryness. Apply azelaic acid 15% gel on first.   Topical retinoid medications like tretinoin/Retin-A, adapalene/Differin, tazarotene/Fabior, and Epiduo/Epiduo Forte can cause dryness and irritation when first started. Only apply a pea-sized amount to the entire affected area. Avoid applying it around the eyes, edges of mouth and creases at the nose. If you experience irritation, use a good moisturizer first and/or apply the medicine less often. If you are doing well with the medicine, you can increase how often you use it until you are applying every night. Be careful with sun protection while using this medication as it can make you sensitive to the sun. This medicine should not be used by pregnant women.    Due to recent changes in healthcare laws, you may see results of your pathology and/or laboratory studies on MyChart before the doctors have had a chance to review them. We understand that in some cases there may be results that are confusing or concerning to you. Please understand that not all results are received at the same time and often the doctors may need to interpret multiple results in order to provide you with the best plan of care or course of treatment. Therefore, we ask that you please give us  2 business days to thoroughly review all your results before contacting the office for clarification. Should we see a critical lab result, you will be contacted sooner.   If You Need Anything After Your Visit  If you have any questions or concerns for your doctor, please call our main line at [phone] and press option 4 to reach your doctor's medical assistant. If no one answers, please leave a voicemail as directed and we will return your call as soon as possible. Messages left after 4 pm will be answered the following  business day.   You may also send us  a message via MyChart. We typically respond to MyChart messages within 1-2 business days.  For prescription refills, please ask your pharmacy to contact our office. Our fax number is [phone].  If you have an urgent issue when the clinic is closed that cannot wait until the next business day, you can page your doctor at the number below.    Please note that while we do our best to be available for urgent issues outside of office hours, we are not available 24/7.   If you have an urgent issue and are unable to reach us , you may choose to seek medical care at your doctor's office, retail clinic, urgent care center, or emergency room.  If you have a medical emergency, please immediately call 911 or go to the emergency department.  Pager Numbers  - Dr. Bary Likes: [phone]  - Dr. Annette Barters: [phone]  - Dr. Felipe Horton: [phone]   In the event of inclement weather, please call our main line at [phone] for an update on the status of any delays or closures.  Dermatology Medication Tips: Please keep the boxes that topical medications come in in order to help keep track of the instructions about where and how to use these. Pharmacies typically print the medication instructions only on the boxes and not directly on the medication tubes.   If your medication is too expensive, please contact our office at [phone] option 4 or send us  a message through MyChart.   We are unable  to tell what your co-pay for medications will be in advance as this is different depending on your insurance coverage. However, we may be able to find a substitute medication at lower cost or fill out paperwork to get insurance to cover a needed medication.   If a prior authorization is required to get your medication covered by your insurance company, please allow us  1-2 business days to complete this process.  Drug prices often vary depending on where the prescription is  filled and some pharmacies may offer cheaper prices.  The website www.goodrx.com contains coupons for medications through different pharmacies. The prices here do not account for what the cost may be with help from insurance (it may be cheaper with your insurance), but the website can give you the price if you did not use any insurance.  - You can print the associated coupon and take it with your prescription to the pharmacy.  - You may also stop by our office during regular business hours and pick up a GoodRx coupon card.  - If you need your prescription sent electronically to a different pharmacy, notify our office through [facility] or by phone at [phone] option 4.     Si Usted Necesita Algo Despus de Su Visita  Tambin puede enviarnos un mensaje a travs de Clinical cytogeneticist. Por lo general respondemos a los mensajes de MyChart en el transcurso de 1 a 2 das hbiles.  Para renovar recetas, por favor pida a su farmacia que se ponga en contacto con nuestra oficina. Franz Jacks de fax es [place] [phone].  Si tiene un asunto urgente cuando la clnica est cerrada y que no puede esperar hasta el siguiente da hbil, puede llamar/localizar a su doctor(a) al nmero que aparece a continuacin.   Por favor, tenga en cuenta que aunque hacemos todo lo posible para estar disponibles para asuntos urgentes fuera del horario de [place], no estamos disponibles las 24 horas del da, los 7 [address]  [address] de la [place].   Si tiene un problema urgente y no puede comunicarse con nosotros, puede optar por buscar atencin mdica  en el consultorio de su doctor(a), en una clnica privada, en un centro de atencin urgente o en una sala de emergencias.  Si tiene Engineer, drilling, por favor llame inmediatamente al 911 o vaya a la sala de emergencias.  Nmeros de bper  - Dr. Bary Likes: [phone]  - Dra. Annette Barters: [phone]  - Dr. Felipe Horton: [phone]   En caso de inclemencias del tiempo, por favor  llame a Lajuan Pila principal al [phone] para una actualizacin sobre el [place] de cualquier retraso o cierre.  Consejos para la medicacin en dermatologa: Por favor, guarde las cajas en las que vienen los medicamentos de uso tpico para ayudarle a seguir las instrucciones sobre dnde y cmo usarlos. Las farmacias generalmente imprimen las instrucciones del medicamento slo en las cajas y no directamente en los tubos del [place].   Si su medicamento es muy caro, por favor, pngase en contacto con Bettyjane Brunet llamando al [phone] y presione la opcin 4 o envenos un mensaje a travs de Clinical cytogeneticist.   No podemos decirle cul ser su copago por los medicamentos por adelantado ya que esto es diferente dependiendo de la cobertura de su seguro. Sin embargo, es posible que podamos encontrar un medicamento sustituto a Audiological scientist un formulario para que el seguro cubra el medicamento que se considera necesario.   Si se requiere una autorizacin previa para que su compaa de  seguros cubra su medicamento, por favor permtanos de 1 a 2 das hbiles para completar [address]  Los precios de los medicamentos varan con frecuencia dependiendo del Environmental consultant de dnde se surte la receta y alguna farmacias pueden ofrecer precios ms baratos.  El sitio web www.goodrx.com tiene cupones para medicamentos de Health and safety inspector. Los precios aqu no tienen en cuenta lo que podra costar con la ayuda del seguro (puede ser ms barato con su seguro), pero el sitio web puede darle el precio si no utiliz Tourist information centre manager.  - Puede imprimir el cupn correspondiente y llevarlo con su receta a la farmacia.  - Tambin puede pasar por nuestra oficina durante el horario de atencin regular y Education officer, museum una tarjeta de cupones de GoodRx.  - Si necesita que su receta se enve electrnicamente a una farmacia diferente, informe a nuestra oficina a travs de MyChart de [place] o por telfono llamando al [phone]  y presione la opcin 4.

## 2024-04-25 NOTE — Progress Notes (Signed)
 Follow-Up Visit   Subjective  Rebecca Carlson is a 43 y.o. female who presents for the following: melasma f/u patient uses hydroquinone  cream 1-2 times, mostly nightly patient does report a little improvement.    The following portions of the chart were reviewed this encounter and updated as appropriate: medications, allergies, medical history  Review of Systems:  No other skin or systemic complaints except as noted in HPI or Assessment and Plan.  Objective  Well appearing patient in no apparent distress; mood and affect are within normal limits.  A focused examination was performed of the following areas: Face  Relevant exam findings are noted in the Assessment and Plan.    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 Condition is improving with treatment but not currently at goal.   Melasma is a chronic; persistent condition of hyperpigmented patches generally on the face, worse in summer due to higher UV exposure.    Heredity; thyroid disease; sun exposure; pregnancy; birth control pills; epilepsy medication and darker skin may predispose to Melasma.   Recommendations include: - Sun avoidance and daily broad spectrum (UVA/UVB) tinted mineral sunscreen SPF 30+, with Zinc or Titanium Dioxide. - Rx topical bleaching creams (i.e. hydroquinone ) is a common treatment but should not be used long term.  Hydroquinones may be mixed with retinoids; vitamin C; steroids; Kojic Acid. - Alastin A-luminate, retinoids, vitamin C, topical tranexamic acid , glycolic acid and kojic acid can be used for brightening while on break from hydroquinone  - Rx Azelaic Acid is also a treatment option that is safe for pregnancy (Category B). - OTC Heliocare can be helpful in control and prevention. - Oral Rx with Tranexamic Acid  250 mg - 650 mg po daily can be used for moderate to  severe cases especially during summer (contraindications include pregnancy; lactation; hx of PE; hx of DVT; clotting disorder; heart disease; anticoagulant use and upcoming long trips)   - Chemical peels (would need multiple for best result).  - Lasers and  Microdermabrasion may also be helpful adjunct treatments.  Treatment Plan: Stop Hydroquinone  4% cream for now to take 2-3 month break start azelaic acid 15% gel use BID (morning and night) start tretinoin 0.025% cream use pea-sized amount nightly, may cause dryness. Apply azelaic acid on first. Continue using daily broad spectrum (UVA/UVB) tinted mineral sunscreen SPF 30+, with Zinc or Titanium Dioxide.      ACNE VULGARIS Exam: Closed comedones at cheeks and nose.  Chronic and persistent condition with duration or expected duration over one year. Condition is bothersome/symptomatic for patient. Currently flared.   Treatment Plan: start azelaic acid 15% gel use BID (morning and night)  start tretinoin 0.025% cream use pea-sized amount nightly, may cause dryness. Apply azelaic acid on first.  Topical retinoid medications like tretinoin/Retin-A, adapalene/Differin, tazarotene/Fabior, and Epiduo/Epiduo Forte can cause dryness and irritation when first started. Only apply a pea-sized amount to the entire affected area. Avoid applying it around the eyes, edges of mouth and creases at the nose. If you experience irritation, use a good moisturizer first and/or apply the medicine less often. If you are doing well with the medicine, you can increase how often you use it until you are applying every night. Be careful with sun protection while using this medication as it can make you sensitive to the sun. This medicine should not be used by pregnant women.  Return in about 3 months (around 07/26/2024) for w/ Dr. Annette Barters for melasma, acne .  I, [name], CMA, am acting as scribe for Artemio Larry, MD .   Documentation: I have reviewed  the above documentation for accuracy and completeness, and I agree with the above.  Artemio Larry, MD

## 2024-05-11 ENCOUNTER — Other Ambulatory Visit: Payer: Medicaid Other

## 2024-05-17 ENCOUNTER — Ambulatory Visit: Admitting: Certified Nurse Midwife

## 2024-05-17 DIAGNOSIS — J4530 Mild persistent asthma, uncomplicated: Secondary | ICD-10-CM | POA: Diagnosis not present

## 2024-05-17 DIAGNOSIS — Z3009 Encounter for other general counseling and advice on contraception: Secondary | ICD-10-CM

## 2024-05-17 DIAGNOSIS — J302 Other seasonal allergic rhinitis: Secondary | ICD-10-CM | POA: Diagnosis not present

## 2024-05-17 DIAGNOSIS — Z0000 Encounter for general adult medical examination without abnormal findings: Secondary | ICD-10-CM | POA: Diagnosis not present

## 2024-05-17 MED ORDER — NORGESTIMATE-ETH ESTRADIOL 0.25-35 MG-MCG PO TABS
1.0000 | ORAL_TABLET | Freq: Every day | ORAL | 2 refills | Status: DC
Start: 2024-05-17 — End: 2024-08-14

## 2024-05-17 MED ORDER — CETIRIZINE HCL 5 MG/5ML PO SOLN
10.0000 mg | Freq: Every day | ORAL | 11 refills | Status: DC
Start: 2024-05-17 — End: 2024-05-17

## 2024-05-17 MED ORDER — CETIRIZINE HCL 5 MG/5ML PO SOLN
10.0000 mg | Freq: Every day | ORAL | 11 refills | Status: DC
Start: 2024-05-17 — End: 2024-09-15

## 2024-05-17 MED ORDER — NORGESTIMATE-ETH ESTRADIOL 0.25-35 MG-MCG PO TABS: 1.0000 | ORAL_TABLET | Freq: Every day | ORAL | 2 refills | Status: DC

## 2024-05-17 MED ORDER — ALBUTEROL SULFATE HFA 108 (90 BASE) MCG/ACT IN AERS
2.0000 | INHALATION_SPRAY | Freq: Four times a day (QID) | RESPIRATORY_TRACT | 2 refills | Status: DC | PRN
Start: 2024-05-17 — End: 2024-05-17

## 2024-05-17 MED ORDER — ALBUTEROL SULFATE HFA 108 (90 BASE) MCG/ACT IN AERS
2.0000 | INHALATION_SPRAY | Freq: Four times a day (QID) | RESPIRATORY_TRACT | 2 refills | Status: DC | PRN
Start: 2024-05-17 — End: 2024-09-15

## 2024-05-17 NOTE — Progress Notes (Signed)
 ANNUAL EXAM Patient name: Rebecca Carlson MRN [id number]  Date of birth: 1981/10/30 Chief Complaint:   Gynecologic Exam  History of Present Illness:   Rebecca Carlson is a 43 y.o. [phone] female being seen today for a routine annual exam.  Current complaints: No concerns. She is doing well with OCPs and has noticed a decrease in the amount and frequency of vaginal bleeding. Requesting refill of OCPs and allergy medicine.  No LMP recorded. (Menstrual status: Oral contraceptives).   Upstream - 05/17/24 0843       Pregnancy Intention Screening   Does the patient want to become pregnant in the next year? No    Does the patient's partner want to become pregnant in the next year? No    Would the patient like to discuss contraceptive options today? No      Contraception Wrap Up   Current Method No Contraceptive Precautions    End Method No Contraception Precautions    Contraception Counseling Provided No    How was the end contraceptive method provided? N/A            The pregnancy intention screening data noted above was reviewed. Potential methods of contraception were discussed. The patient elected to proceed with No Contraception Precautions.   Last pap 04/21/2023. Results were: NILM w/ HRHPV negative. H/O abnormal pap: no Last mammogram: 11/10/2023. 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 Diagnostic schedule for next month. Family h/o breast cancer: no Last colonoscopy: N/A. Results were: N/A. Family h/o colorectal cancer: no     05/17/2024    8:43 AM 01/18/2024    3:46 PM 10/07/2023   11:10 AM 04/21/2023    1:51 PM 02/09/2023   10:36 AM  Depression screen PHQ 2/9  Decreased Interest 0 0 0 0 0  Down, Depressed, Hopeless 0 0 0 0 0  PHQ - 2 Score 0 0 0 0 0  Altered sleeping 0  0 0 0  Tired, decreased energy 0  0 0 0  Change in appetite 0  0 0 0  Feeling bad or failure about yourself  0  0 0 0  Trouble concentrating 0  0 0 0  Moving  slowly or fidgety/restless 0  0 0 0  Suicidal thoughts 0  0 0 0  PHQ-9 Score 0  0 0 0  Difficult doing work/chores   Not difficult at all          05/17/2024    8:43 AM 01/18/2024    3:46 PM 10/07/2023   11:10 AM 04/21/2023    1:51 PM  GAD 7 : Generalized Anxiety Score  Nervous, Anxious, on Edge 0 0 0 0  Control/stop worrying 0 0 0 0  Worry too much - different things 0 0 0 0  Trouble relaxing 0 0 0 0  Restless 0 0 0 0  Easily annoyed or irritable 0 0 0 0  Afraid - awful might happen 0 0 0 0  Total GAD 7 Score 0 0 0 0  Anxiety Difficulty   Not difficult at all      Review of Systems:   Pertinent items are noted in HPI Denies any headaches, blurred vision, fatigue, shortness of breath, chest pain, abdominal pain, abnormal vaginal discharge/itching/odor/irritation, problems with periods, bowel movements, urination, or intercourse unless otherwise stated above.  Pertinent History Reviewed:  Reviewed past medical,surgical, social and family history.  Reviewed problem list, medications and allergies.  Physical Assessment:   Vitals:   05/17/24  0842  BP: 101/67  Pulse: 85  Weight: 60.8 kg   Body mass index is 24.51 kg/m.   Physical Examination:  General appearance - well appearing, and in no distress Mental status - alert, oriented to person, place, and time Psych:  She has a normal mood and affect Skin - warm and dry, normal color, no suspicious lesions noted Chest - effort normal, no problems with respiration noted Heart - normal rate and regular rhythm Neck:  midline trachea, no thyromegaly or nodules Abdomen - soft, nontender, nondistended, no masses or organomegaly Extremities:  No swelling or varicosities noted   No results found for this or any previous visit (from the past 24 hours).  Assessment & Plan:      1. Counseling for birth control, oral contraceptives - norgestimate -ethinyl estradiol  (ESTARYLLA) 0.25-35 MG-MCG tablet; Take 1 tablet by mouth daily.   Dispense: 28 tablet; Refill: 2  2. Seasonal allergies - albuterol  (VENTOLIN  HFA) 108 (90 Base) MCG/ACT inhaler; Inhale 2 puffs into the lungs every 6 (six) hours as needed for wheezing or shortness of breath.  Dispense: 8.5 g; Refill: 2 - cetirizine  HCl (ZYRTEC ) 5 MG/5ML SOLN; Take 10 mLs (10 mg total) by mouth daily.  Dispense: 473 mL; Refill: 11  3. Mild persistent asthma without complication - albuterol  (VENTOLIN  HFA) 108 (90 Base) MCG/ACT inhaler; Inhale 2 puffs into the lungs every 6 (six) hours as needed for wheezing or shortness of breath.  Dispense: 8.5 g; Refill: 2  - Mammogram scheduled - Colon cancer screening is no yet recommended. - Routine preventative health maintenance measures emphasized. - Please refer to After Visit Summary for other counseling recommendations.       Mammogram: Keep scheduled diagnostic appointment. Colonoscopy: @ 42yo, or sooner if problems  No orders of the defined types were placed in this encounter.   Meds:  Meds ordered this encounter  Medications   DISCONTD: norgestimate -ethinyl estradiol  (ESTARYLLA) 0.25-35 MG-MCG tablet    Sig: Take 1 tablet by mouth daily.    Dispense:  28 tablet    Refill:  2   DISCONTD: cetirizine  HCl (ZYRTEC ) 5 MG/5ML SOLN    Sig: Take 10 mLs (10 mg total) by mouth daily.    Dispense:  473 mL    Refill:  11   DISCONTD: albuterol  (VENTOLIN  HFA) 108 (90 Base) MCG/ACT inhaler    Sig: Inhale 2 puffs into the lungs every 6 (six) hours as needed for wheezing or shortness of breath.    Dispense:  8.5 g    Refill:  2   albuterol  (VENTOLIN  HFA) 108 (90 Base) MCG/ACT inhaler    Sig: Inhale 2 puffs into the lungs every 6 (six) hours as needed for wheezing or shortness of breath.    Dispense:  8.5 g    Refill:  2   cetirizine  HCl (ZYRTEC ) 5 MG/5ML SOLN    Sig: Take 10 mLs (10 mg total) by mouth daily.    Dispense:  473 mL    Refill:  11   norgestimate -ethinyl estradiol  (ESTARYLLA) 0.25-35 MG-MCG tablet    Sig: Take 1  tablet by mouth daily.    Dispense:  28 tablet    Refill:  2    Follow-up: No follow-ups on file.  Wilford Hanks, Student-MidWife 05/17/2024 2:12 PM

## 2024-08-14 ENCOUNTER — Other Ambulatory Visit: Payer: Self-pay | Admitting: Certified Nurse Midwife

## 2024-08-14 ENCOUNTER — Other Ambulatory Visit: Payer: Self-pay

## 2024-08-14 DIAGNOSIS — Z3009 Encounter for other general counseling and advice on contraception: Secondary | ICD-10-CM

## 2024-08-14 MED ORDER — NORGESTIMATE-ETH ESTRADIOL 0.25-35 MG-MCG PO TABS
1.0000 | ORAL_TABLET | Freq: Every day | ORAL | 12 refills | Status: AC
Start: 2024-08-14 — End: ?

## 2024-08-14 NOTE — Telephone Encounter (Addendum)
 Pt came to front office requesting a refill on her BCP.  Per chart review, pt was last seen on 05/17/24.  E-prescribed refill x one year.   Fateh Kindle,RN  08/14/24

## 2024-08-15 ENCOUNTER — Ambulatory Visit: Admitting: Dermatology

## 2024-09-06 ENCOUNTER — Ambulatory Visit: Payer: Self-pay

## 2024-09-06 NOTE — Telephone Encounter (Signed)
 Attempted to make pt appt : pt only wanted to be seen by Barnie Louder - no available appts in near future: pt did not want anyone else and stated she will go to the clinic her self to speak with Barnie.

## 2024-09-06 NOTE — Telephone Encounter (Signed)
 FYI Only or Action Required?: FYI only for provider.  Patient was last seen in primary care on n/a.  Called Nurse Triage reporting Shortness of Breath.  Symptoms began x 2 months.  Interventions attempted: Nothing.  Symptoms are: gradually worsening.  Triage Disposition: See PCP When Office is Open (Within 3 Days)  Patient/caregiver understands and will follow disposition?: No, refuses disposition    Red Word that prompted transfer to Nurse Triage: shortness of breath with a cough. This has been happening for 2 months. Weakness and feeling like she will pass out.      Reason for Disposition  [1] MODERATE longstanding difficulty breathing (e.g., speaks in phrases, SOB even at rest, pulse 100-120) AND [2] SAME as normal  Answer Assessment - Initial Assessment Questions 1. RESPIRATORY STATUS: Describe your breathing? (e.g., wheezing, shortness of breath, unable to speak, severe coughing)      SOB, cough, wheezing 2. ONSET: When did this breathing problem begin?      X 2  3. PATTERN Does the difficult breathing come and go, or has it been constant since it started?      constant 4. SEVERITY: How bad is your breathing? (e.g., mild, moderate, severe)      moderate 5. RECURRENT SYMPTOM: Have you had difficulty breathing before? If Yes, ask: When was the last time? and What happened that time?      no 6. CARDIAC HISTORY: Do you have any history of heart disease? (e.g., heart attack, angina, bypass surgery, angioplasty)      na 7. LUNG HISTORY: Do you have any history of lung disease?  (e.g., pulmonary embolus, asthma, emphysema)     na 8. CAUSE: What do you think is causing the breathing problem?      unknown 9. OTHER SYMPTOMS: Do you have any other symptoms? (e.g., chest pain, cough, dizziness, fever, runny nose)     Cough, sputum, sneezing, runny nose 10. O2 SATURATION MONITOR:  Do you use an oxygen saturation monitor (pulse oximeter) at home? If Yes,  ask: What is your reading (oxygen level) today? What is your usual oxygen saturation reading? (e.g., 95%)       na 11. PREGNANCY: Is there any chance you are pregnant? When was your last menstrual period?       no 12. TRAVEL: Have you traveled out of the country in the last month? (e.g., travel history, exposures)       no  Protocols used: Breathing Difficulty-A-AH

## 2024-09-07 ENCOUNTER — Ambulatory Visit: Payer: Self-pay | Admitting: Family Medicine

## 2024-09-15 ENCOUNTER — Encounter: Payer: Self-pay | Admitting: Nurse Practitioner

## 2024-09-15 ENCOUNTER — Ambulatory Visit: Attending: Nurse Practitioner | Admitting: Nurse Practitioner

## 2024-09-15 VITALS — BP 114/77 | HR 65 | Ht 62.0 in | Wt 138.8 lb

## 2024-09-15 DIAGNOSIS — J4531 Mild persistent asthma with (acute) exacerbation: Secondary | ICD-10-CM

## 2024-09-15 DIAGNOSIS — R109 Unspecified abdominal pain: Secondary | ICD-10-CM

## 2024-09-15 MED ORDER — COMBIVENT RESPIMAT 20-100 MCG/ACT IN AERS
1.0000 | INHALATION_SPRAY | Freq: Four times a day (QID) | RESPIRATORY_TRACT | 6 refills | Status: AC
Start: 2024-09-15 — End: ?

## 2024-09-15 MED ORDER — LEVOCETIRIZINE DIHYDROCHLORIDE 5 MG PO TABS
5.0000 mg | ORAL_TABLET | Freq: Every day | ORAL | 1 refills | Status: AC
Start: 2024-09-15 — End: ?

## 2024-09-15 MED ORDER — MONTELUKAST SODIUM 10 MG PO TABS
10.0000 mg | ORAL_TABLET | Freq: Every day | ORAL | 3 refills | Status: AC
Start: 2024-09-15 — End: ?

## 2024-09-15 MED ORDER — ALBUTEROL SULFATE HFA 108 (90 BASE) MCG/ACT IN AERS
2.0000 | INHALATION_SPRAY | Freq: Four times a day (QID) | RESPIRATORY_TRACT | 2 refills | Status: AC | PRN
Start: 2024-09-15 — End: ?

## 2024-09-15 MED ORDER — BENZONATATE 200 MG PO CAPS
200.0000 mg | ORAL_CAPSULE | Freq: Three times a day (TID) | ORAL | 1 refills | Status: AC
Start: 2024-09-15 — End: ?

## 2024-09-15 MED ORDER — BUDESONIDE-FORMOTEROL FUMARATE 80-4.5 MCG/ACT IN AERO: 2.0000 | INHALATION_SPRAY | Freq: Two times a day (BID) | RESPIRATORY_TRACT | 3 refills | Status: DC

## 2024-09-15 NOTE — Progress Notes (Signed)
 Assessment & Plan:  [name] was seen today for shortness of breath.  Diagnoses and all orders for this visit:  Mild persistent asthma with acute exacerbation -     albuterol  (VENTOLIN  HFA) 108 (90 Base) MCG/ACT inhaler; Inhale 2 puffs into the lungs every 6 (six) hours as needed for wheezing or shortness of breath. -     Ipratropium-Albuterol  (COMBIVENT  RESPIMAT) 20-100 MCG/ACT AERS respimat; Inhale 1 puff into the lungs in the morning, at noon, in the evening, and at bedtime. -     montelukast  (SINGULAIR ) 10 MG tablet; Take 1 tablet (10 mg total) by mouth at bedtime. -     levocetirizine (XYZAL ) 5 MG tablet; Take 1 tablet (5 mg total) by mouth daily. -     benzonatate  (TESSALON ) 200 MG capsule; Take 1 capsule (200 mg total) by mouth every 8 (eight) hours. DOSE CHANGE -     Ambulatory referral to Allergy -     budesonide -formoterol  (SYMBICORT ) 80-4.5 MCG/ACT inhaler; Inhale 2 puffs into the lungs 2 (two) times daily. -     Eosinophil count Chronic cough with dyspnea and sputum production suggests asthma or allergic airway disease. Overuse of rescue inhaler indicates poor symptom control. - Educated on proper inhaler technique and demonstrated correct usage. - Ordered allergy testing to assess severity of allergies. - Referred to allergy and asthma specialist for further evaluation and management. - Refilled blue inhaler prescription. - Instructed to use Combivent  four times daily as prescribed.  Right flank pain -     Urinalysis, Complete Side pain for two months, possibly musculoskeletal or renal, including kidney stones. - Ordered urinalysis to evaluate for kidney stones. - Ordered liver function tests to rule out hepatic causes.   Patient has been counseled on age-appropriate routine health concerns for screening and prevention. These are reviewed and up-to-date. Referrals have been placed accordingly. Immunizations are up-to-date or declined.    Subjective:   Chief Complaint   Patient presents with   Shortness of Breath    Right side pain    History of Present Illness Rebecca Carlson is a 43 year old female with asthma who presents with a persistent cough and difficulty breathing.  She is a patient of Dr. Vicci   She has been experiencing a persistent cough and difficulty breathing since 09-2023. She uses two inhalers: Combivent , which she has been instructed to use four times a day but recently reduced to twice a day due to palpitations (likely due to overuse of saba and nebs), and albuterol , which she finds more effective but has run out of.  She experiences shortness of breath, cough, and sputum production, prompting frequent inhaler use. Medications tried include the following:  SABA, zyrtec , tessalon  and prednisone , symbicort , flonase , combivent .   She is currently using combivent  but based on self demonstration in office today is using incorrectly. I did show her a video of how to use it correctly.  She is also using her albuterol  inhaler 4 times a day.  We discussed the correct way to use her Combivent  inhaler and her rescue inhaler.  She reports experiencing right-sided abdominal pain for the past two months, which is particularly severe in the morning and sometimes prevents her from sleeping on that side. She describes the pain as strong and feels as though there is 'something here'.   Her sister had a very narrow heart valve, which causes concern for similar symptoms.She is concerned she may have a heart problem like her sister stating that  her sister has a narrow heart valve which was discovered after she was worked up for chronic cough    Review of Systems  Constitutional:  Negative for fever, malaise/fatigue and weight loss.  HENT: Negative.  Negative for nosebleeds.   Eyes: Negative.  Negative for blurred vision, double vision and photophobia.  Respiratory:  Positive for cough, sputum production and shortness of breath. Negative for  hemoptysis and wheezing.   Cardiovascular: Negative.  Negative for chest pain, palpitations and leg swelling.  Gastrointestinal: Negative.  Negative for heartburn, nausea and vomiting.  Genitourinary:  Positive for flank pain.  Musculoskeletal:  Negative for myalgias.  Neurological: Negative.  Negative for dizziness, focal weakness, seizures and headaches.  Psychiatric/Behavioral: Negative.  Negative for suicidal ideas.     Past Medical History:  Diagnosis Date   Breakthrough bleeding on Nexplanon  01/05/2023   Gestational diabetes    Medical history non-contributory    Previous cesarean section--subsequent VBAC x 3 12/03/2015   TOLAC consent signed 10/29/21    Past Surgical History:  Procedure Laterality Date   CESAREAN SECTION      Family History  Problem Relation Age of Onset   Diabetes Sister    Hypertension Sister    Diabetes Sister    Valvular heart disease Sister    Thyroid disease Sister    Diabetes Brother    Breast cancer Neg Hx     Social History Reviewed with no changes to be made today.   Outpatient Medications Prior to Visit  Medication Sig Dispense Refill   cyclobenzaprine  (FLEXERIL ) 5 MG tablet Take 1 tablet (5 mg total) by mouth daily as needed for muscle spasms. 30 tablet 0   fluticasone  (FLONASE ) 50 MCG/ACT nasal spray 1 spray daily each nostril x 1 wk then PRN 16 g 1   hydroquinone  4 % cream APPLY TO TO THE AFFECTED AREA TWICE DAILY. APPLY TWICE DAILY EVERY MORNING AND EVERY NIGHT AT BEDTIME TO SPOTS ON FACE 28.35 g 1   norgestimate -ethinyl estradiol  (ESTARYLLA) 0.25-35 MG-MCG tablet Take 1 tablet by mouth daily. 28 tablet 12   tretinoin  (RETIN-A ) 0.025 % cream Use pea-sized amount nightly to aa. 45 g 0   albuterol  (VENTOLIN  HFA) 108 (90 Base) MCG/ACT inhaler Inhale 2 puffs into the lungs every 6 (six) hours as needed for wheezing or shortness of breath. 8.5 g 2   budesonide -formoterol  (SYMBICORT ) 80-4.5 MCG/ACT inhaler Inhale 2 puffs into the lungs 2  (two) times daily. 1 each 3   cetirizine  HCl (ZYRTEC ) 5 MG/5ML SOLN Take 10 mLs (10 mg total) by mouth daily. 473 mL 11   Azelaic Acid  15 % gel After skin is thoroughly washed and patted dry, gently but thoroughly massage a thin film of azelaic acid  cream into the affected area twice daily, in the morning and evening. (Patient not taking: Reported on 09/15/2024) 50 g 2   benzonatate  (TESSALON ) 100 MG capsule Take 1 capsule (100 mg total) by mouth every 8 (eight) hours. (Patient not taking: Reported on 09/15/2024) 21 capsule 0   predniSONE  (DELTASONE ) 20 MG tablet 1 tab Po daily x 4 days then 1/2 tab daily x 4 days (Patient not taking: Reported on 09/15/2024) 6 tablet 0   promethazine -dextromethorphan (PROMETHAZINE -DM) 6.25-15 MG/5ML syrup Take 5 mLs by mouth at bedtime as needed for cough. (Patient not taking: Reported on 09/15/2024) 118 mL 0   No facility-administered medications prior to visit.    No Known Allergies     Objective:    BP 114/77  Pulse 65   Ht 5' 2 (1.575 m)   Wt 138 lb 12.8 oz (63 kg)   SpO2 100%   BMI 25.39 kg/m  Wt Readings from Last 3 Encounters:  09/15/24 138 lb 12.8 oz (63 kg)  05/17/24 134 lb (60.8 kg)  01/18/24 133 lb (60.3 kg)    Physical Exam Vitals and nursing note reviewed.  Constitutional:      Appearance: She is well-developed.  HENT:     Head: Normocephalic and atraumatic.  Cardiovascular:     Rate and Rhythm: Normal rate and regular rhythm.     Heart sounds: Normal heart sounds. No murmur heard.    No friction rub. No gallop.  Pulmonary:     Effort: Pulmonary effort is normal. No tachypnea or respiratory distress.     Breath sounds: Normal breath sounds. No decreased breath sounds, wheezing, rhonchi or rales.  Chest:     Chest wall: No tenderness.  Abdominal:     General: There is no distension.     Palpations: There is no mass.  Musculoskeletal:        General: Normal range of motion.     Cervical back: Normal range of motion.  Skin:     General: Skin is warm and dry.  Neurological:     Mental Status: She is alert and oriented to person, place, and time.     Coordination: Coordination normal.  Psychiatric:        Behavior: Behavior normal. Behavior is cooperative.        Thought Content: Thought content normal.        Judgment: Judgment normal.          Patient has been counseled extensively about nutrition and exercise as well as the importance of adherence with medications and regular follow-up. The patient was given clear instructions to go to ER or return to medical center if symptoms don't improve, worsen or new problems develop. The patient verbalized understanding.   Follow-up: Return in about 2 months (around 11/15/2024) for asthma.   Haze LELON Servant, FNP-BC [facility] [place], [place] [phone]   09/15/2024, 9:20 AM

## 2024-09-16 LAB — MICROSCOPIC EXAMINATION
Bacteria, UA: NONE SEEN
Casts: NONE SEEN /LPF
RBC, Urine: NONE SEEN /HPF (ref 0–2)

## 2024-09-16 LAB — URINALYSIS, COMPLETE
Bilirubin, UA: NEGATIVE
Glucose, UA: NEGATIVE
Ketones, UA: NEGATIVE
Nitrite, UA: NEGATIVE
Protein,UA: NEGATIVE
Specific Gravity, UA: 1.01 (ref 1.005–1.030)
Urobilinogen, Ur: 0.2 mg/dL (ref 0.2–1.0)
pH, UA: 6.5 (ref 5.0–7.5)

## 2024-09-16 LAB — EOSINOPHIL COUNT: EOS (ABSOLUTE): 0.4 x10E3/uL (ref 0.0–0.4)

## 2024-09-20 ENCOUNTER — Ambulatory Visit: Payer: Self-pay | Admitting: Nurse Practitioner

## 2024-10-17 ENCOUNTER — Ambulatory Visit (INDEPENDENT_AMBULATORY_CARE_PROVIDER_SITE_OTHER): Payer: Self-pay | Admitting: Allergy & Immunology

## 2024-10-17 ENCOUNTER — Encounter: Payer: Self-pay | Admitting: Nurse Practitioner

## 2024-10-17 ENCOUNTER — Encounter: Payer: Self-pay | Admitting: Allergy & Immunology

## 2024-10-17 VITALS — BP 100/66 | HR 73 | Temp 98.2°F | Ht 62.5 in | Wt 142.1 lb

## 2024-10-17 DIAGNOSIS — J189 Pneumonia, unspecified organism: Secondary | ICD-10-CM

## 2024-10-17 DIAGNOSIS — J310 Chronic rhinitis: Secondary | ICD-10-CM | POA: Diagnosis not present

## 2024-10-17 DIAGNOSIS — J4530 Mild persistent asthma, uncomplicated: Secondary | ICD-10-CM

## 2024-10-17 MED ORDER — FLUTICASONE-SALMETEROL 250-50 MCG/ACT IN AEPB: 1.0000 | INHALATION_SPRAY | Freq: Two times a day (BID) | RESPIRATORY_TRACT | 5 refills | Status: DC

## 2024-10-17 NOTE — Patient Instructions (Addendum)
 1. Chronic rhinitis - Because of insurance stipulations, we cannot do skin testing on the same day as your first visit. - We are all working to fight this, but for now we need to do two separate visits.  - We will know more after we do testing at the next visit.  - The skin testing visit can be squeezed in at your convenience.  - Then we can make a more full plan to address all of your symptoms. - Be sure to stop your antihistamines for 3 days before this appointment.  - You can remain on the montelukast  during the testing.  2. Mild persistent asthma, uncomplicated - Lung testing looks good today. - I would STOP the Combivent  and start an asthma controller medication called Advair instead (this contains a long acting albuterol  combined with an inhaled steroid to help with inflammation in the lungs. - The Advair will allow you to use LESS albuterol  over time, ideally.  - I would like to get another chest X-ray to check on that pneumonia and make sure that it improved. - Daily controller medication(s): Advair 250/59mcg one puff twice daily - Prior to physical activity: albuterol  2 puffs 10-15 minutes before physical activity. - Rescue medications: albuterol  4 puffs every 4-6 hours as needed and albuterol  nebulizer one vial every 4-6 hours as needed - Asthma control goals:  * Full participation in all desired activities (may need albuterol  before activity) * Albuterol  use two time or less a week on average (not counting use with activity) * Cough interfering with sleep two time or less a month * Oral steroids no more than once a year * No hospitalizations  3. Return in about 1 week (around 10/24/2024) for SKIN TESTING (1-55). You can have the follow up appointment with Dr. Iva or a Nurse Practicioner (our Nurse Practitioners are excellent and always have Physician oversight!).    Please inform us  of any Emergency Department visits, hospitalizations, or changes in symptoms. Call us  before  going to the ED for breathing or allergy symptoms since we might be able to fit you in for a sick visit. Feel free to contact us  anytime with any questions, problems, or concerns.  It was a pleasure to meet you today!  Websites that have reliable patient information: 1. American Academy of Asthma, Allergy, and Immunology: www.aaaai.org 2. Food Allergy Research and Education (FARE): foodallergy.org 3. Mothers of Asthmatics: http://www.asthmacommunitynetwork.org 4. American College of Allergy, Asthma, and Immunology: www.acaai.org      "Like" us  on Facebook and Instagram for our latest updates!      A healthy democracy works best when [employer] participate! Make sure you are registered to vote! If you have moved or changed any of your contact information, you will need to get this updated before voting! Scan the QR codes below to learn more!      Check out this information handout from the [employer] of Asthma, Allergy, and Immunology on rhinitis!   https://rebrand.ly/AAC-Rhinitis

## 2024-10-17 NOTE — Progress Notes (Unsigned)
 NEW PATIENT  Date of Service/Encounter:  10/17/24  Consult requested by: No primary care provider on file.   Assessment:   No diagnosis found.  Plan/Recommendations:   There are no Patient Instructions on file for this visit.   {Blank single:19197::This note in its entirety was forwarded to the Provider who requested this consultation.}  Subjective:   Twisha Vanpelt is a 43 y.o. female presenting today for evaluation of  Chief Complaint  Patient presents with   Establish Care    Pt is here to establish care, she has an interpreter present with her today. She has having allergy and or asthma symptoms but she is unsure and would like to gain control of symptoms.     Kollyns Mickelson has a history of the following: There are no active problems to display for this patient.   History obtained from: chart review and {Persons; PED relatives w/patient:19415::patient}.  Discussed the use of AI scribe software for clinical note transcription with the patient and/or guardian, who gave verbal consent to proceed.  Chidinma Clites was referred by No primary care provider on file.Tawona Filsinger is a 43 y.o. female presenting for {Blank single:19197::a food challenge,a drug challenge,skin testing,a sick visit,an evaluation of ***,a follow up visit}.    Asthma/Respiratory Symptom History: ***  Allergic Rhinitis Symptom History: ***  Food Allergy Symptom History: ***  Skin Symptom History: ***  GERD Symptom History: ***  Infection Symptom History: ***  ***Otherwise, there is no history of other atopic diseases, including {Blank multiple:19196:o:asthma,food allergies,drug allergies,environmental allergies,stinging insect allergies,eczema,urticaria,contact dermatitis}. There is no significant infectious history. ***Vaccinations are up to date.    Past Medical History: There are no active problems to display for this patient.   Medication List:   Allergies as of 10/17/2024   No Known Allergies      Medication List        Accurate as of October 17, 2024  2:31 PM. If you have any questions, ask your nurse or doctor.          albuterol  108 (90 Base) MCG/ACT inhaler Commonly known as: VENTOLIN  HFA Inhale into the lungs every 6 (six) hours as needed for wheezing or shortness of breath.   albuterol  0.63 MG/3ML nebulizer solution Commonly known as: ACCUNEB  Take 1 ampule by nebulization every 6 (six) hours as needed for wheezing (Dose unknown 10/17/2024).   benzonatate  200 MG capsule Commonly known as: TESSALON  Take 200 mg by mouth 3 (three) times daily as needed for cough.   cetirizine  10 MG tablet Commonly known as: ZYRTEC  Take 10 mg by mouth daily.   Combivent  Respimat 20-100 MCG/ACT Aers respimat Generic drug: Ipratropium-Albuterol  Inhale 1 puff into the lungs in the morning, at noon, in the evening, and at bedtime.   levocetirizine 5 MG tablet Commonly known as: XYZAL  Take 5 mg by mouth daily.   montelukast  10 MG tablet Commonly known as: SINGULAIR  Take 10 mg by mouth at bedtime.        Birth History: {Blank single:19197::non-contributory,born premature and spent time in the NICU,born at term without complications}  Developmental History: Naiomi has met all milestones on time. She has required no {Blank multiple:19196:a:speech therapy,occupational therapy,physical therapy}. ***non-contributory  Past Surgical History: Past Surgical History:  Procedure Laterality Date   CESAREAN SECTION N/A 2004     Family History: History reviewed. No pertinent family history.   Social History: Nickia lives at home with ***.    Review of systems otherwise negative other than  that mentioned in the HPI.    Objective:   Blood pressure 100/66, pulse 73, temperature 98.2 F (36.8 C), temperature source Temporal, height 5' 2.5 (1.588 m), weight 142 lb 1.6 oz (64.5 kg), SpO2 100%. Body mass index is  25.58 kg/m.      Physical Exam   Diagnostic studies: {Blank single:19197::none,deferred due to recent antihistamine use,deferred due to insurance stipulations that require a separate visit for testing,labs sent instead, }  Spirometry: results normal (FEV1: 2.61/100%, FVC: 3.43/109%, FEV1/FVC: 76%).    Spirometry consistent with normal pattern. {Blank single:19197::Albuterol /Atrovent nebulizer,Xopenex/Atrovent nebulizer,Albuterol  nebulizer,Albuterol  four puffs via MDI,Xopenex four puffs via MDI} treatment given in clinic with {Blank single:19197::significant improvement in FEV1 per ATS criteria,significant improvement in FVC per ATS criteria,significant improvement in FEV1 and FVC per ATS criteria,improvement in FEV1, but not significant per ATS criteria,improvement in FVC, but not significant per ATS criteria,improvement in FEV1 and FVC, but not significant per ATS criteria,no improvement}.  Allergy Studies: {Blank single:19197::none,deferred due to recent antihistamine use,deferred due to insurance stipulations that require a separate visit for testing,labs sent instead, }    {Blank single:19197::Allergy testing results were read and interpreted by myself, documented by clinical staff., }         Marty Shaggy, MD [facility] 

## 2024-10-18 ENCOUNTER — Encounter: Payer: Self-pay | Admitting: Allergy & Immunology

## 2024-10-19 ENCOUNTER — Telehealth: Payer: Self-pay

## 2024-10-19 NOTE — Telephone Encounter (Signed)
*  AA  Pharmacy Patient Advocate Encounter   Received notification from Fax that prior authorization for Wixela is required/requested.   Insurance verification completed.   The patient is insured through [facility].   Per test claim:  Brand Advair Diskus is preferred by the insurance.  If suggested medication is appropriate, Please send in a new RX and discontinue this one. If not, please advise as to why it's not appropriate so that we may request a Prior Authorization. Please note, some preferred medications may still require a PA.  If the suggested medications have not been trialed and there are no contraindications to their use, the PA will not be submitted, as it will not be approved.

## 2024-10-20 MED ORDER — FLUTICASONE-SALMETEROL 250-50 MCG/ACT IN AEPB: 1.0000 | INHALATION_SPRAY | Freq: Two times a day (BID) | RESPIRATORY_TRACT | 5 refills | Status: AC

## 2024-10-23 NOTE — Telephone Encounter (Signed)
 I called the patient using [employer]. The interpreter called the number ending in [phone]. It did not go through. Then called the number ending in [phone]. The interpreter then said she would call the number again to leave a vm but then the line dropped. I called [employer] again and we tried the number ending in [phone] and it did not work again. The interpreter called the number ending in [phone] and left a message. Mychart sent as well.

## 2024-10-23 NOTE — Telephone Encounter (Signed)
 Husband called in and wife was on another line at the same time and I informed them that the medication was changed to Advair and it was called in to the pharmacy.  They both expressed understanding.

## 2024-11-07 ENCOUNTER — Ambulatory Visit (INDEPENDENT_AMBULATORY_CARE_PROVIDER_SITE_OTHER): Admitting: Allergy & Immunology

## 2024-11-07 DIAGNOSIS — J3089 Other allergic rhinitis: Secondary | ICD-10-CM | POA: Diagnosis not present

## 2024-11-07 DIAGNOSIS — J302 Other seasonal allergic rhinitis: Secondary | ICD-10-CM

## 2024-11-07 NOTE — Progress Notes (Unsigned)
 FOLLOW UP  Date of Service/Encounter:  11/07/24   Assessment:   Perennial and seasonal allergic rhinitis (grasses, weeds, trees, outdoor molds, dust mites, cat, and dog) - interested in starting allergy shots   Mild persistent asthma, uncomplicated   Recurrent pneumonia  Plan/Recommendations:    1. Chronic rhinitis - Testing today showed: grasses, weeds, trees, outdoor molds, dust mites, cat, and dog - Copy of test results provided.  - Avoidance measures provided. - Restart the allergy medications (Zyrtec  given today in clinic).  - Continue with: Xyzal  (levocetirizine) 5mg  tablet once daily, Singulair  (montelukast ) 10mg  daily, and Flonase  (fluticasone ) one spray per nostril daily (AIM FOR EAR ON EACH SIDE) - You can use an extra dose of the antihistamine, if needed, for breakthrough symptoms.  - Consider nasal saline rinses 1-2 times daily to remove allergens from the nasal cavities as well as help with mucous clearance (this is especially helpful to do before the nasal sprays are given) - Consider allergy shots as a means of long-term control. - Allergy shots re-train and reset the immune system to ignore environmental allergens and decrease the resulting immune response to those allergens (sneezing, itchy watery eyes, runny nose, nasal congestion, etc).    - Allergy shots improve symptoms in 75-85% of patients.  - We can discuss more at the next appointment if the medications are not working for you.  2. Mild persistent asthma, uncomplicated - Lung testing looked good at the first visit.  - Continue with the Advair as you are doing.  - Get the chest X-ray when you get a chance.  - Daily controller medication(s): Advair 250/54mcg one puff twice daily - Prior to physical activity: albuterol  2 puffs 10-15 minutes before physical activity. - Rescue medications: albuterol  4 puffs every 4-6 hours as needed and albuterol  nebulizer one vial every 4-6 hours as needed - Asthma  control goals:  * Full participation in all desired activities (may need albuterol  before activity) * Albuterol  use two time or less a week on average (not counting use with activity) * Cough interfering with sleep two time or less a month * Oral steroids no more than once a year * No hospitalizations  3. Return in about 6 months (around 05/07/2025). You can have the follow up appointment with Dr. Iva or a Nurse Practicioner (our Nurse Practitioners are excellent and always have Physician oversight!).    Subjective:   Rebecca Carlson is a 43 y.o. 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  Discussed the use of AI scribe software for clinical note transcription with the patient and/or guardian, who gave verbal consent to proceed.  [name] is a 43 y.o. female presenting for skin testing. She was last seen on October 28th. We could not do testing because her insurance company does not cover testing on the same day as a New Patient visit. She has been off of all antihistamines 3 days in anticipation of the testing.   Otherwise, there have been no  changes to her past medical history, surgical history, family history, or social history.    Review of systems otherwise negative other than that mentioned in the HPI.    Objective:   There were no vitals taken for this visit. There is no height or weight on file to calculate BMI.     Physical exam deferred since this was a skin  testing appointment only.   Diagnostic studies:    Allergy Studies:     Airborne Adult Perc - 11/07/24 1431     Time Antigen Placed 1431    Allergen Manufacturer Jestine    Location Back    Number of Test 55    1. Control-Buffer 50% Glycerol Negative    2. Control-Histamine 2+    3. Bahia Negative    4. Bermuda Negative    5. Johnson Negative    6. Kentucky  Blue Negative    7. Meadow Fescue Negative    8. Perennial Rye Negative    9. Timothy Negative    10. Ragweed Mix Negative    11. Cocklebur Negative    12. Plantain,  English Negative    13. Baccharis Negative    14. Dog Fennel Negative    15. Russian Thistle Negative    16. Lamb's Quarters Negative    17. Sheep Sorrell Negative    18. Rough Pigweed Negative    19. Marsh Elder, Rough Negative    20. Mugwort, Common Negative    21. Box, Elder Negative    22. Cedar, red Negative    23. Sweet Gum Negative    24. Pecan Pollen 2+    25. Pine Mix Negative    26. Walnut, Black Pollen Negative    27. Red Mulberry Negative    28. Ash Mix Negative    29. Birch Mix Negative    30. Beech American Negative    31. Cottonwood, Eastern Negative    32. Hickory, White 2+    33. Maple Mix Negative    34. Oak, Eastern Mix Negative    35. Sycamore Eastern Negative    36. Alternaria Alternata Negative    37. Cladosporium Herbarum Negative    38. Aspergillus Mix Negative    39. Penicillium Mix Negative    40. Bipolaris Sorokiniana (Helminthosporium) Negative    41. Drechslera Spicifera (Curvularia) Negative    42. Mucor Plumbeus Negative    43. Fusarium Moniliforme Negative    44. Aureobasidium Pullulans (pullulara) Negative    45. Rhizopus Oryzae Negative    46. Botrytis Cinera Negative    47. Epicoccum Nigrum Negative    48. Phoma Betae Negative    49. Dust Mite Mix Negative    50. Cat Hair 10,000 BAU/ml Negative    51.  Dog Epithelia Negative    52. Mixed Feathers Negative    53. Horse Epithelia Negative    54.  Cockroach, German Negative    55. Tobacco Leaf Negative          Intradermal - 11/07/24 1501     Time Antigen Placed 1510    Allergen Manufacturer Jestine    Location Arm    Number of Test 15    Control Negative    Bahia 2+    Bermuda Negative    Johnson 2+    7 Grass Negative    Ragweed Mix Negative    Weed Mix 3+    Mold 1 Negative    Mold 2 Negative    Mold 3 1+  Mold 4 Negative    Mite Mix 4+    Cat 4+    Dog 2+    Cockroach Negative          Allergy testing results were read and interpreted by myself, documented by clinical staff.      Marty Shaggy, MD  [facility] 

## 2024-11-07 NOTE — Patient Instructions (Addendum)
 1. Chronic rhinitis - Testing today showed: grasses, weeds, trees, outdoor molds, dust mites, cat, and dog - Copy of test results provided.  - Avoidance measures provided. - Restart the allergy medications (Zyrtec  given today in clinic).  - Continue with: Xyzal  (levocetirizine) 5mg  tablet once daily, Singulair  (montelukast ) 10mg  daily, and Flonase  (fluticasone ) one spray per nostril daily (AIM FOR EAR ON EACH SIDE) - You can use an extra dose of the antihistamine, if needed, for breakthrough symptoms.  - Consider nasal saline rinses 1-2 times daily to remove allergens from the nasal cavities as well as help with mucous clearance (this is especially helpful to do before the nasal sprays are given) - Consider allergy shots as a means of long-term control. - Allergy shots re-train and reset the immune system to ignore environmental allergens and decrease the resulting immune response to those allergens (sneezing, itchy watery eyes, runny nose, nasal congestion, etc).    - Allergy shots improve symptoms in 75-85% of patients.  - We can discuss more at the next appointment if the medications are not working for you.  2. Mild persistent asthma, uncomplicated - Lung testing looked good at the first visit.  - Continue with the Advair as you are doing.  - Get the chest X-ray when you get a chance.  - Daily controller medication(s): Advair 250/81mcg one puff twice daily - Prior to physical activity: albuterol  2 puffs 10-15 minutes before physical activity. - Rescue medications: albuterol  4 puffs every 4-6 hours as needed and albuterol  nebulizer one vial every 4-6 hours as needed - Asthma control goals:  * Full participation in all desired activities (may need albuterol  before activity) * Albuterol  use two time or less a week on average (not counting use with activity) * Cough interfering with sleep two time or less a month * Oral steroids no more than once a year * No hospitalizations  3. Return  in about 6 months (around 05/07/2025). You can have the follow up appointment with Dr. Iva or a Nurse Practicioner (our Nurse Practitioners are excellent and always have Physician oversight!).    Please inform us  of any Emergency Department visits, hospitalizations, or changes in symptoms. Call us  before going to the ED for breathing or allergy symptoms since we might be able to fit you in for a sick visit. Feel free to contact us  anytime with any questions, problems, or concerns.  It was a pleasure to see you again today!  Websites that have reliable patient information: 1. American Academy of Asthma, Allergy, and Immunology: www.aaaai.org 2. Food Allergy Research and Education (FARE): foodallergy.org 3. Mothers of Asthmatics: http://www.asthmacommunitynetwork.org 4. American College of Allergy, Asthma, and Immunology: www.acaai.org      "Like" us  on Facebook and Instagram for our latest updates!      A healthy democracy works best when [employer] participate! Make sure you are registered to vote! If you have moved or changed any of your contact information, you will need to get this updated before voting! Scan the QR codes below to learn more!      Check out this information handout from the [employer] of Asthma, Allergy, and Immunology on rhinitis!   [email or web address]      Airborne Adult Perc - 11/07/24 1431     Time Antigen Placed 1431    Allergen Manufacturer Jestine    Location Back    Number of Test 55    1. Control-Buffer 50% Glycerol Negative    2. Control-Histamine 2+  3. Bahia Negative    4. Bermuda Negative    5. Johnson Negative    6. [place]  Blue Negative    7. Meadow Fescue Negative    8. Perennial Rye Negative    9. Timothy Negative    10. Ragweed Mix Negative    11. Cocklebur Negative    12. Plantain,  English Negative    13. Baccharis Negative    14. Dog Fennel Negative    15. Russian Thistle Negative    16. Lamb's  Quarters Negative    17. Sheep Sorrell Negative    18. Rough Pigweed Negative    19. Marsh Elder, Rough Negative    20. Mugwort, Common Negative    21. Box, Elder Negative    22. Cedar, red Negative    23. Sweet Gum Negative    24. Pecan Pollen 2+    25. Pine Mix Negative    26. Walnut, Black Pollen Negative    27. Red Mulberry Negative    28. Ash Mix Negative    29. Birch Mix Negative    30. Beech American Negative    31. Cottonwood, Eastern Negative    32. Hickory, White 2+    33. Maple Mix Negative    34. Oak, Eastern Mix Negative    35. Sycamore Eastern Negative    36. Alternaria Alternata Negative    37. Cladosporium Herbarum Negative    38. Aspergillus Mix Negative    39. Penicillium Mix Negative    40. Bipolaris Sorokiniana (Helminthosporium) Negative    41. Drechslera Spicifera (Curvularia) Negative    42. Mucor Plumbeus Negative    43. Fusarium Moniliforme Negative    44. Aureobasidium Pullulans (pullulara) Negative    45. Rhizopus Oryzae Negative    46. Botrytis Cinera Negative    47. Epicoccum Nigrum Negative    48. Phoma Betae Negative    49. Dust Mite Mix Negative    50. Cat Hair 10,000 BAU/ml Negative    51.  Dog Epithelia Negative    52. Mixed Feathers Negative    53. Horse Epithelia Negative    54. Cockroach, German Negative    55. Tobacco Leaf Negative          Intradermal - 11/07/24 1501     Time Antigen Placed 1510    Allergen Manufacturer Jestine    Location Arm    Number of Test 15    Control Negative    Bahia 2+    Bermuda Negative    Johnson 2+    7 Grass Negative    Ragweed Mix Negative    Weed Mix 3+    Mold 1 Negative    Mold 2 Negative    Mold 3 1+    Mold 4 Negative    Mite Mix 4+    Cat 4+    Dog 2+    Cockroach Negative          Reducing Pollen Exposure  The American Academy of Allergy, Asthma and Immunology suggests the following steps to reduce your exposure to pollen during allergy seasons.    Do not hang sheets  or clothing out to dry; pollen may collect on these items. Do not mow lawns or spend time around freshly cut grass; mowing stirs up pollen. Keep windows closed at night.  Keep car windows closed while driving. Minimize morning activities outdoors, a time when pollen counts are usually at their highest. Stay indoors as much as possible when pollen counts or  humidity is high and on windy days when pollen tends to remain in the air longer. Use air conditioning when possible.  Many air conditioners have filters that trap the pollen spores. Use a HEPA room air filter to remove pollen form the indoor air you breathe.  Control of Mold Allergen   Mold and fungi can grow on a variety of surfaces provided certain temperature and moisture conditions exist.  Outdoor molds grow on plants, decaying vegetation and soil.  The major outdoor mold, Alternaria and Cladosporium, are found in very high numbers during hot and dry conditions.  Generally, a late Summer - Fall peak is seen for common outdoor fungal spores.  Rain will temporarily lower outdoor mold spore count, but counts rise rapidly when the rainy period ends.  The most important indoor molds are Aspergillus and Penicillium.  Dark, humid and poorly ventilated basements are ideal sites for mold growth.  The next most common sites of mold growth are the bathroom and the kitchen.  Outdoor (Seasonal) Mold Control  Positive outdoor molds via skin testing: Bipolaris (Helminthsporium), Drechslera (Curvalaria), and Mucor  Use air conditioning and keep windows closed Avoid exposure to decaying vegetation. Avoid leaf raking. Avoid grain handling. Consider wearing a face mask if working in moldy areas.     Control of Dog or Cat Allergen  Avoidance is the best way to manage a dog or cat allergy. If you have a dog or cat and are allergic to dog or cats, consider removing the dog or cat from the home. If you have a dog or cat but don't want to find it a new  home, or if your family wants a pet even though someone in the household is allergic, here are some strategies that may help keep symptoms at bay:  Keep the pet out of your bedroom and restrict it to only a few rooms. Be advised that keeping the dog or cat in only one room will not limit the allergens to that room. Don't pet, hug or kiss the dog or cat; if you do, wash your hands with soap and water. High-efficiency particulate air (HEPA) cleaners run continuously in a bedroom or living room can reduce allergen levels over time. Regular use of a high-efficiency vacuum cleaner or a central vacuum can reduce allergen levels. Giving your dog or cat a bath at least once a week can reduce airborne allergen.  Control of Dust Mite Allergen    Dust mites play a major role in allergic asthma and rhinitis.  They occur in environments with high humidity wherever human skin is found.  Dust mites absorb humidity from the atmosphere (ie, they do not drink) and feed on organic matter (including shed human and animal skin).  Dust mites are a microscopic type of insect that you cannot see with the naked eye.  High levels of dust mites have been detected from mattresses, pillows, carpets, upholstered furniture, bed covers, clothes, soft toys and any woven material.  The principal allergen of the dust mite is found in its feces.  A gram of dust may contain 1,000 mites and 250,000 fecal particles.  Mite antigen is easily measured in the air during house cleaning activities.  Dust mites do not bite and do not cause harm to humans, other than by triggering allergies/asthma.    Ways to decrease your exposure to dust mites in your home:  Encase mattresses, box springs and pillows with a mite-impermeable barrier or cover   Wash sheets, blankets and  drapes weekly in hot water (130 F) with detergent and dry them in a dryer on the hot setting.  Have the room cleaned frequently with a vacuum cleaner and a damp dust-mop.  For  carpeting or rugs, vacuuming with a vacuum cleaner equipped with a high-efficiency particulate air (HEPA) filter.  The dust mite allergic individual should not be in a room which is being cleaned and should wait 1 hour after cleaning before going into the room. Do not sleep on upholstered furniture (eg, couches).   If possible removing carpeting, upholstered furniture and drapery from the home is ideal.  Horizontal blinds should be eliminated in the rooms where the person spends the most time (bedroom, study, television room).  Washable vinyl, roller-type shades are optimal. Remove all non-washable stuffed toys from the bedroom.  Wash stuffed toys weekly like sheets and blankets above.   Reduce indoor humidity to less than 50%.  Inexpensive humidity monitors can be purchased at most hardware stores.  Do not use a humidifier as can make the problem worse and are not recommended.  Allergy Shots  Allergies are the result of a chain reaction that starts in the immune system. Your immune system controls how your body defends itself. For instance, if you have an allergy to pollen, your immune system identifies pollen as an invader or allergen. Your immune system overreacts by producing antibodies called Immunoglobulin E (IgE). These antibodies travel to cells that release chemicals, causing an allergic reaction.  The concept behind allergy immunotherapy, whether it is received in the form of shots or tablets, is that the immune system can be desensitized to specific allergens that trigger allergy symptoms. Although it requires time and patience, the payback can be long-term relief. Allergy injections contain a dilute solution of those substances that you are allergic to based upon your skin testing and allergy history.   How Do Allergy Shots Work?  Allergy shots work much like a vaccine. Your body responds to injected amounts of a particular allergen given in increasing doses, eventually developing a  resistance and tolerance to it. Allergy shots can lead to decreased, minimal or no allergy symptoms.  There generally are two phases: build-up and maintenance. Build-up often ranges from three to six months and involves receiving injections with increasing amounts of the allergens. The shots are typically given once or twice a week, though more rapid build-up schedules are sometimes used.  The maintenance phase begins when the most effective dose is reached. This dose is different for each person, depending on how allergic you are and your response to the build-up injections. Once the maintenance dose is reached, there are longer periods between injections, typically two to four weeks.  Occasionally doctors give cortisone-type shots that can temporarily reduce allergy symptoms. These types of shots are different and should not be confused with allergy immunotherapy shots.  Who Can Be Treated with Allergy Shots?  Allergy shots may be a good treatment approach for people with allergic rhinitis (hay fever), allergic asthma, conjunctivitis (eye allergy) or stinging insect allergy.   Before deciding to begin allergy shots, you should consider:   The length of allergy season and the severity of your symptoms  Whether medications and/or changes to your environment can control your symptoms  Your desire to avoid long-term medication use  Time: allergy immunotherapy requires a major time commitment  Cost: may vary depending on your insurance coverage  Allergy shots for children age 67 and older are effective and often well tolerated. They  might prevent the onset of new allergen sensitivities or the progression to asthma.  Allergy shots are not started on patients who are pregnant but can be continued on patients who become pregnant while receiving them. In some patients with other medical conditions or who take certain common medications, allergy shots may be of risk. It is important to mention other  medications you talk to your allergist.   What are the two types of build-ups offered:   RUSH or Rapid Desensitization -- one day of injections lasting from 8:30-4:30pm, injections every 1 hour.  Approximately half of the build-up process is completed in that one day.  The following week, normal build-up is resumed, and this entails ~16 visits either weekly or twice weekly, until reaching your "maintenance dose" which is continued weekly until eventually getting spaced out to every month for a duration of 3 to 5 years. The regular build-up appointments are nurse visits where the injections are administered, followed by required monitoring for 30 minutes.    Traditional build-up -- weekly visits for 6 -12 months until reaching "maintenance dose", then continue weekly until eventually spacing out to every 4 weeks as above. At these appointments, the injections are administered, followed by required monitoring for 30 minutes.     Either way is acceptable, and both are equally effective. With the rush protocol, the advantage is that less time is spent here for injections overall AND you would also reach maintenance dosing faster (which is when the clinical benefit starts to become more apparent). Not everyone is a candidate for rapid desensitization.   IF we proceed with the RUSH protocol, there are premedications which must be taken the day before and the day after the rush only (this includes antihistamines, steroids, and Singulair ).  After the rush day, no prednisone  or Singulair  is required, and we just recommend antihistamines taken on your injection day.  What Is An Estimate of the Costs?  If you are interested in starting allergy injections, please check with your insurance company about your coverage for both allergy vial sets and allergy injections.  Please do so prior to making the appointment to start injections.  The following are CPT codes to give to your insurance company. These are the  amounts we BILL to the insurance company, but the amount YOU WILL PAY and WE RECEIVE IS SUBSTANTIALLY LESS and depends on the contracts we have with different insurance companies.   Amount Billed to Insurance Two allergy vial set  CPT 95165   $ 2400     Two injections   CPT 95117   $ 40  Regarding the allergy injections, your co-pay may or may not apply with each injection, so please confirm this with your insurance company. When you start allergy injections, 1 or 2 sets of vials are made based on your allergies.  Not all patients can be on one set of vials. A set of vials lasts 6 months to a year depending on how quickly you can proceed with your build-up of your allergy injections. Vials are personalized for each patient depending on their specific allergens.  How often are allergy injection given during the build-up period?   Injections are given at least weekly during the build-up period until your maintenance dose is achieved. Per the doctor's discretion, you may have the option of getting allergy injections two times per week during the build-up period. However, there must be at least 48 hours between injections. The build-up period is usually completed within 6-12  months depending on your ability to schedule injections and for adjustments for reactions. When maintenance dose is reached, your injection schedule is gradually changed to every two weeks and later to every three weeks. Injections will then continue every 4 weeks. Usually, injections are continued for a total of 3-5 years.   When Will I Feel Better?  Some may experience decreased allergy symptoms during the build-up phase. For others, it may take as long as 12 months on the maintenance dose. If there is no improvement after a year of maintenance, your allergist will discuss other treatment options with you.  If you aren't responding to allergy shots, it may be because there is not enough dose of the allergen in your vaccine or there  are missing allergens that were not identified during your allergy testing. Other reasons could be that there are high levels of the allergen in your environment or major exposure to non-allergic triggers like tobacco smoke.  What Is the Length of Treatment?  Once the maintenance dose is reached, allergy shots are generally continued for three to five years. The decision to stop should be discussed with your allergist at that time. Some people may experience a permanent reduction of allergy symptoms. Others may relapse and a longer course of allergy shots can be considered.  What Are the Possible Reactions?  The two types of adverse reactions that can occur with allergy shots are local and systemic. Common local reactions include very mild redness and swelling at the injection site, which can happen immediately or several hours after. Report a delayed reaction from your last injection. These include arm swelling or runny nose, watery eyes or cough that occurs within 12-24 hours after injection. A systemic reaction, which is less common, affects the entire body or a particular body system. They are usually mild and typically respond quickly to medications. Signs include increased allergy symptoms such as sneezing, a stuffy nose or hives.   Rarely, a serious systemic reaction called anaphylaxis can develop. Symptoms include swelling in the throat, wheezing, a feeling of tightness in the chest, nausea or dizziness. Most serious systemic reactions develop within 30 minutes of allergy shots. This is why it is strongly recommended you wait in your doctor's office for 30 minutes after your injections. Your allergist is trained to watch for reactions, and his or her staff is trained and equipped with the proper medications to identify and treat them.   Report to the nurse immediately if you experience any of the following symptoms: swelling, itching or redness of the skin, hives, watery eyes/nose, breathing  difficulty, excessive sneezing, coughing, stomach pain, diarrhea, or light headedness. These symptoms may occur within 15-20 minutes after injection and may require medication.   Who Should Administer Allergy Shots?  The preferred location for receiving shots is your prescribing allergist's office. Injections can sometimes be given at another facility where the physician and staff are trained to recognize and treat reactions, and have received instructions by your prescribing allergist.  What if I am late for an injection?   Injection dose will be adjusted depending upon how many days or weeks you are late for your injection.   What if I am sick?   Please report any illness to the nurse before receiving injections. She may adjust your dose or postpone injections depending on your symptoms. If you have fever, flu, sinus infection or chest congestion it is best to postpone allergy injections until you are better. Never get an allergy injection if  your asthma is causing you problems. If your symptoms persist, seek out medical care to get your health problem under control.  What If I am or Become Pregnant:  Women that become pregnant should schedule an appointment with [facility] before receiving any further allergy injections.

## 2024-11-08 ENCOUNTER — Encounter: Payer: Self-pay | Admitting: Allergy & Immunology

## 2024-11-22 ENCOUNTER — Telehealth: Payer: Self-pay | Admitting: Internal Medicine

## 2024-11-22 ENCOUNTER — Telehealth: Payer: Self-pay

## 2024-11-22 DIAGNOSIS — J3089 Other allergic rhinitis: Secondary | ICD-10-CM

## 2024-11-22 NOTE — Telephone Encounter (Signed)
 LVM TO CONFIRMA PPT FOR 12/4

## 2024-11-22 NOTE — Telephone Encounter (Signed)
 Patient came into office today to bring back her forms for allergy injections. She would like to move forward with the injections. Dr. Iva, Please send in rx for the injections.  Damita, please add these to your list. Thank you all!

## 2024-11-23 ENCOUNTER — Encounter: Payer: Self-pay | Admitting: Internal Medicine

## 2024-11-23 ENCOUNTER — Ambulatory Visit: Attending: Internal Medicine | Admitting: Internal Medicine

## 2024-11-23 VITALS — BP 110/71 | HR 80 | Temp 97.9°F | Ht 62.0 in | Wt 137.8 lb

## 2024-11-23 DIAGNOSIS — J302 Other seasonal allergic rhinitis: Secondary | ICD-10-CM

## 2024-11-23 DIAGNOSIS — J4531 Mild persistent asthma with (acute) exacerbation: Secondary | ICD-10-CM

## 2024-11-23 DIAGNOSIS — Z8639 Personal history of other endocrine, nutritional and metabolic disease: Secondary | ICD-10-CM | POA: Diagnosis not present

## 2024-11-23 DIAGNOSIS — N912 Amenorrhea, unspecified: Secondary | ICD-10-CM

## 2024-11-23 DIAGNOSIS — Z23 Encounter for immunization: Secondary | ICD-10-CM | POA: Diagnosis not present

## 2024-11-23 DIAGNOSIS — J4530 Mild persistent asthma, uncomplicated: Secondary | ICD-10-CM

## 2024-11-23 MED ORDER — FLUTICASONE PROPIONATE 50 MCG/ACT NA SUSP: NASAL | 1 refills | Status: AC

## 2024-11-23 NOTE — Progress Notes (Signed)
 Patient ID: Rebecca Carlson, female    DOB: January 13, 1981  MRN: [id number]  CC: Asthma (Asthma f/u./Reports only taking Cetirizine  & advair /Requesting blood work for pregnancy & iron/Discuss flu & pneumonia vax)   Subjective: Rebecca Carlson is a 43 y.o. female who presents for chronic ds management. Ronal Houseman, NP is with me. Her concerns today include:  Hx of IDA, asthma   [employer] Language interpreter used during this encounter #:  [phone], Aseel  Discussed the use of AI scribe software for clinical note transcription with the patient, who gave verbal consent to proceed.  History of Present Illness Rebecca Carlson is a 43 year old female who presents requesting a pregnancy test and evaluation of iron deficiency symptoms.  She is currently on birth control pills but missed three doses on the 8th, 10th, and 16th of her menstrual cycle last month. Her last menstrual period started on November 15th. She did not take two pills the day after missing a dose.  She is concerned about iron deficiency due to experiencing pain in the nerves and sometimes cramps in the fingers and toes. Had similar symptoms when she was iron deficient. She previously took iron supplements but has since stopped. The last blood count check for anemia was in February of the previous year.  She has a history of asthma and is currently using an Advair inhaler. Feels symptoms well controlled with this inhaler. She has seen the allergist and plans to start weekly allergy injections in two weeks. She also requested a refill for Flonase  nasal spray.  She expressed interest in receiving both the flu and pneumonia vaccines.     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     albuterol  (VENTOLIN  HFA) 108 (90 Base) MCG/ACT inhaler Inhale 2 puffs into the lungs every 6 (six) hours as needed for wheezing or shortness of breath. 8.5 g 2   Azelaic Acid  15 % gel After skin is thoroughly washed and patted dry, gently but thoroughly massage a thin film of azelaic acid  cream into the affected area twice daily, in the morning and evening. 50 g 2   benzonatate  (TESSALON ) 200 MG capsule Take 1 capsule (200 mg total) by mouth every 8 (eight) hours. DOSE CHANGE 60 capsule 1   budesonide -formoterol  (SYMBICORT ) 80-4.5 MCG/ACT inhaler Inhale 2 puffs into the lungs 2 (two) times daily. 1 each 3   cetirizine  (ZYRTEC ) 10 MG tablet Take 10 mg by mouth daily.     cyclobenzaprine  (FLEXERIL ) 5 MG tablet Take 1 tablet (5 mg total) by mouth daily as needed for muscle spasms. 30 tablet 0   fluticasone -salmeterol (ADVAIR DISKUS) 250-50 MCG/ACT AEPB Inhale 1 puff into the lungs in the morning and at bedtime. 60 each 5   Ipratropium-Albuterol  (COMBIVENT  RESPIMAT) 20-100 MCG/ACT AERS respimat Inhale 1 puff into the lungs in the morning, at noon, in the evening, and at bedtime. 4 g 6   levocetirizine (XYZAL ) 5 MG tablet Take 1 tablet (5 mg total)  by mouth daily. 90 tablet 1   montelukast  (SINGULAIR ) 10 MG tablet Take 1 tablet (10 mg total) by mouth at bedtime. 30 tablet 3   norgestimate -ethinyl estradiol  (ESTARYLLA) 0.25-35 MG-MCG tablet Take 1 tablet by mouth daily. 28 tablet 12   albuterol  (VENTOLIN  HFA) 108 (90 Base) MCG/ACT inhaler Inhale into the lungs every 6 (six) hours as needed for wheezing or shortness of breath. (Patient not taking: Reported on 11/23/2024)     benzonatate  (TESSALON ) 200 MG capsule Take 200 mg by mouth  3 (three) times daily as needed for cough. (Patient not taking: Reported on 11/23/2024)     fluticasone  (FLONASE ) 50 MCG/ACT nasal spray 1 spray daily each nostril x 1 wk then PRN (Patient not taking: Reported on 11/23/2024) 16 g 1   hydroquinone  4 % cream APPLY TO TO THE AFFECTED AREA TWICE DAILY. APPLY TWICE DAILY EVERY MORNING AND EVERY NIGHT AT BEDTIME TO SPOTS ON FACE (Patient not taking: Reported on 11/23/2024) 28.35 g 1   Ipratropium-Albuterol  (COMBIVENT  RESPIMAT) 20-100 MCG/ACT AERS respimat Inhale 1 puff into the lungs in the morning, at noon, in the evening, and at bedtime. (Patient not taking: Reported on 11/23/2024)     levocetirizine (XYZAL ) 5 MG tablet Take 5 mg by mouth daily. (Patient not taking: Reported on 11/23/2024)     montelukast  (SINGULAIR ) 10 MG tablet Take 10 mg by mouth at bedtime. (Patient not taking: Reported on 11/23/2024)     tretinoin  (RETIN-A ) 0.025 % cream Use pea-sized amount nightly to aa. (Patient not taking: Reported on 11/23/2024) 45 g 0   [DISCONTINUED] promethazine  (PHENERGAN ) 25 MG tablet Take 0.5-1 tablets (12.5-25 mg total) by mouth every 6 (six) hours as needed for refractory nausea / vomiting (headache). 10 tablet 0   No current facility-administered medications on file prior to visit.    No Known Allergies  Social History   Socioeconomic History   Marital status: Married    Spouse name: Not on file   Number of children: 5   Years of education: high school   Highest education level: High school graduate  Occupational History   Occupation: Stay at home mom   Occupation: House wife  Tobacco Use   Smoking status: Never    Passive exposure: Never   Smokeless tobacco: Never  Vaping Use   Vaping status: Never Used  Substance and Sexual Activity   Alcohol use: Never   Drug use: Never   Sexual activity: Yes    Birth control/protection: Implant    Comment: nexplanon  placed 01/29/22  Other Topics Concern   Not on file  Social History Narrative   **  Merged History Encounter **       ** Merged History Encounter **       ** Data from: 11/24/22 Enc Dept: [facility]   ** Merged History Encounter **           ** Data from: 08/11/22 Enc Dept: [facility]   Can read in English, speaks a little english   Social Drivers of Health   Financial Resource Strain: Low Risk  (10/07/2023)   Overall Financial Resource Strain (CARDIA)    Difficulty of Paying Living Expenses: Not hard at all  Food Insecurity: No Food Insecurity (05/17/2024)   Hunger Vital Sign    Worried About Running Out of Food in the Last Year: Never true    Ran Out of Food in the Last Year: Never true  Transportation Needs: No Transportation Needs (05/17/2024)  PRAPARE - Administrator, Civil Service (Medical): No    Lack of Transportation (Non-Medical): No  Physical Activity: Inactive (10/07/2023)   Exercise Vital Sign    Days of Exercise per Week: 0 days    Minutes of Exercise per Session: 0 min  Stress: No Stress Concern Present (10/07/2023)   [employer] of Occupational Health - Occupational Stress Questionnaire    Feeling of Stress : Not at all  Social Connections: Moderately Isolated (10/07/2023)   Social Connection and Isolation Panel    Frequency of Communication with Friends and Family: Twice a week    Frequency of Social Gatherings with Friends and Family: Twice a week    Attends Religious Services: Never    Database Administrator or Organizations: No    Attends Banker Meetings: Never    Marital Status: Married  Catering Manager Violence: Not At Risk (10/07/2023)   Humiliation, Afraid, Rape, and Kick questionnaire    Fear of Current or Ex-Partner: No    Emotionally Abused: No    Physically Abused: No    Sexually Abused: No    Family History  Problem Relation Age of Onset   Diabetes Sister    Hypertension Sister    Diabetes Sister    Valvular heart disease Sister    Thyroid disease Sister     Diabetes Brother    Breast cancer Neg Hx     Past Surgical History:  Procedure Laterality Date   CESAREAN SECTION     CESAREAN SECTION N/A 2004    ROS: Review of Systems Negative except as stated above  PHYSICAL EXAM: BP 110/71 (BP Location: Left Arm, Patient Position: Sitting, Cuff Size: Normal)   Pulse 80   Temp 97.9 F (36.6 C) (Oral)   Ht 5' 2 (1.575 m)   Wt 137 lb 12.8 oz (62.5 kg)   SpO2 100%   BMI 25.20 kg/m   Physical Exam  General appearance - alert, well appearing, middle age female and in no distress Mental status - normal mood, behavior, speech, dress, motor activity, and thought processes Chest - clear to auscultation, no wheezes, rales or rhonchi, symmetric air entry Heart - normal rate, regular rhythm, normal S1, S2, no murmurs, rubs, clicks or gallops Extremities - peripheral pulses normal, no pedal edema, no clubbing or cyanosis      Latest Ref Rng & Units 02/09/2023   11:47 AM 01/22/2020    4:29 PM 11/07/2015   11:25 AM  CMP  Glucose 70 - 99 mg/dL 899  867  89   BUN 6 - 24 mg/dL 10  6  <5   Creatinine 0.57 - 1.00 mg/dL 9.31  9.43  9.44   Sodium 134 - 144 mmol/L 136  137  135   Potassium 3.5 - 5.2 mmol/L 4.5  3.8  3.9   Chloride 96 - 106 mmol/L 102  105  108   CO2 20 - 29 mmol/L 21  22  20    Calcium 8.7 - 10.2 mg/dL 9.6  9.2  8.6   Total Protein 6.0 - 8.5 g/dL 6.7  6.7  5.6   Total Bilirubin 0.0 - 1.2 mg/dL 0.3  0.5  0.5   Alkaline Phos 44 - 121 IU/L 82  48  103   AST 0 - 40 IU/L 13  16  18    ALT 0 - 32 IU/L 6  13  10     Lipid Panel  No results found for: CHOL, TRIG, HDL,  CHOLHDL, VLDL, LDLCALC, LDLDIRECT  CBC    Component Value Date/Time   WBC 7.9 02/09/2023 1147   WBC 7.3 12/01/2022 1120   RBC 4.10 02/09/2023 1147   RBC 4.40 12/01/2022 1120   HGB 11.9 02/09/2023 1147   HCT 35.6 02/09/2023 1147   PLT 349 02/09/2023 1147   MCV 87 02/09/2023 1147   MCH 29.0 02/09/2023 1147   MCH 28.6 12/01/2022 1120   MCHC 33.4  02/09/2023 1147   MCHC 32.8 12/01/2022 1120   RDW 11.8 02/09/2023 1147   LYMPHSABS 2.2 09/29/2021 1624   MONOABS 0.5 11/07/2015 1125   EOSABS 0.4 09/15/2024 0914   BASOSABS 0.0 09/29/2021 1624    ASSESSMENT AND PLAN:  Assessment & Plan Amenorrhea Missed birth control pills on November 8th, 10th, and 16th. She is wanting blood pregnancy test stating the in pasyt pregnancies, the urine test took too long to give positive results. - Ordered blood pregnancy test. - Will send results via MyChart.  Encouraged to read the package insert on her birth control pills that usually instructs to take 2 pills the following day if she misses taking a pill the day before.  Mild persistent asthma Well-controlled with Advair inhaler. Plans to start allergy injections in two weeks through her allergist Dr. Iva. - Continue Advair inhaler.  Seasonal allergic rhinitis Managed with Flonase  nasal spray. - Refilled Flonase  nasal spray.  History of iron deficiency - Ordered blood test to check for anemia.  General health maintenance Discussed flu and pneumonia vaccinations. Flu vaccine administered today. Pneumonia vaccine to be administered after confirming negative pregnancy test. - Administered flu vaccine. - Will schedule nurse-only visit in one week for pneumonia vaccine if pregnancy test is negative.  Patient was given the opportunity to ask questions.  Patient verbalized understanding of the plan and was able to repeat key elements of the plan.   This documentation was completed using Paediatric nurse.  Any transcriptional errors are unintentional.  No orders of the defined types were placed in this encounter.    Requested Prescriptions   Pending Prescriptions Disp Refills   fluticasone  (FLONASE ) 50 MCG/ACT nasal spray 16 g 1    Sig: 1 spray daily each nostril x 1 wk then PRN    No follow-ups on file.  Barnie Louder, MD, FACP

## 2024-11-23 NOTE — Patient Instructions (Signed)
  VISIT SUMMARY: Today, we addressed your concerns about a possible pregnancy, iron deficiency symptoms, asthma management, and seasonal allergies. We also discussed and administered the flu vaccine, with plans for the pneumonia vaccine pending your pregnancy test results.  YOUR PLAN: -AMENORRHEA: Amenorrhea means the absence of menstruation. You missed several birth control pills last month, so we ordered a blood pregnancy test for earlier detection. We will review the results via MyChart.  -MILD PERSISTENT ASTHMA: Asthma is a condition where your airways narrow and swell, producing extra mucus. Your asthma is well-controlled with the Advair inhaler, and you plan to start allergy injections in two weeks. Continue using your Advair inhaler as prescribed.  -SEASONAL ALLERGIC RHINITIS: Seasonal allergic rhinitis is an allergic reaction to pollen and other allergens that causes sneezing, congestion, and a runny nose. We have refilled your Flonase  nasal spray to help manage your symptoms.  -HISTORY OF IRON DEFICIENCY: Iron deficiency can cause symptoms like nerve pain and muscle cramps. We ordered a blood test to check for anemia, which is a condition where you lack enough healthy red blood cells to carry adequate oxygen to your body's tissues.  -GENERAL HEALTH MAINTENANCE: We discussed the importance of vaccinations. You received the flu vaccine today. We will schedule a nurse-only visit in one week for the pneumonia vaccine if your pregnancy test is negative.  INSTRUCTIONS: Please check MyChart for your blood pregnancy test results. If the test is negative, we will schedule a nurse-only visit in one week for your pneumonia vaccine. Continue using your Advair inhaler and Flonase  nasal spray as prescribed. We will also review your blood test results for anemia once they are available.                      Contains text generated by [employer]bridge.                                  Contains text generated by Abridge.

## 2024-11-24 ENCOUNTER — Ambulatory Visit: Payer: Self-pay | Admitting: Internal Medicine

## 2024-11-24 LAB — CBC
Hematocrit: 41.7 % (ref 34.0–46.6)
Hemoglobin: 13.2 g/dL (ref 11.1–15.9)
MCH: 28.4 pg (ref 26.6–33.0)
MCHC: 31.7 g/dL (ref 31.5–35.7)
MCV: 90 fL (ref 79–97)
Platelets: 396 x10E3/uL (ref 150–450)
RBC: 4.65 x10E6/uL (ref 3.77–5.28)
RDW: 11.9 % (ref 11.7–15.4)
WBC: 10 x10E3/uL (ref 3.4–10.8)

## 2024-11-24 LAB — BETA HCG QUANT (REF LAB): hCG Quant: <1 m[IU]/mL

## 2024-11-27 NOTE — Addendum Note (Signed)
 Addended by: DANIEL NIVIA DEL on: 11/27/2024 04:51 PM   Modules accepted: Orders

## 2024-11-30 ENCOUNTER — Ambulatory Visit

## 2024-11-30 ENCOUNTER — Ambulatory Visit: Attending: Internal Medicine

## 2024-11-30 DIAGNOSIS — Z23 Encounter for immunization: Secondary | ICD-10-CM | POA: Diagnosis not present

## 2024-11-30 NOTE — Progress Notes (Signed)
 Pneumonia and flu vaccine administered per protocols.  Information sheet given. Patient denies and pain or discomfort at injection site. Tolerated injection well no reaction.

## 2024-12-04 NOTE — Progress Notes (Signed)
 WILL MAKE CLOSER TO APPT TIME

## 2024-12-04 NOTE — Progress Notes (Signed)
 Aeroallergen Immunotherapy   Ordering Provider: Marty Shaggy, MD   Patient Details  Name: Clarke Amburn  MRN: [id number]  Date of Birth: 27-Jan-1981   Order 2 of 2   Vial Label: Molds/DM   0.2 ml (Volume)  1:20 Concentration -- Bipolaris sorokiniana  0.2 ml (Volume)  1:20 Concentration -- Drechslera spicifera  0.2 ml (Volume)  1:10 Concentration -- Mucor plumbeus  1.0 ml (Volume)   AU Concentration -- Mite Mix (DF 5,000 & DP 5,000)    1.6  ml Extract Subtotal  3.4  ml Diluent   5.0  ml Maintenance Total   Schedule:  A   Green Vial (1:1,000): Schedule A (10 doses)  Blue Vial (1:100): Schedule A (10 doses)  Yellow Vial (1:10): Schedule A (10 doses)  Red Vial (1:1): Schedule A (14 doses)   Special Instructions: After completion of the first Red Vial, please space to every two weeks. After completion of the second Red Vial, please space to every 4 weeks. Ok to up dose new vials on Schedule D. Ok to come twice weekly, if desired, as long as there is 48 hours between injections.

## 2024-12-04 NOTE — Progress Notes (Signed)
 Aeroallergen Immunotherapy  Ordering Provider: Marty Shaggy, MD  Patient Details Name: Rebecca Carlson MRN: [id number] Date of Birth: [birth year]  Order 1 of 2  Vial Label: G/W/T/C/D  0.2 ml (Volume)  1:20 Concentration -- Bahia 0.2 ml (Volume)  1:20 Concentration -- Johnson 0.5 ml (Volume)  1:20 Concentration -- Weed Mix* 0.5 ml (Volume)  1:20 Concentration -- Eastern 10 Tree Mix* 0.2 ml (Volume)  1:10 Concentration -- Pecan Pollen 0.2 ml (Volume)  1:10 Concentration -- Hickory* 0.7 ml (Volume)  1:10 Concentration -- Cat Hair 0.7 ml (Volume)  1:10 Concentration -- Dog Epithelia   3.2  ml Extract Subtotal 1.8  ml Diluent  5.0  ml Maintenance Total  Schedule:  A  Green Vial (1:1,000): Schedule A (10 doses) Blue Vial (1:100): Schedule A (10 doses) Yellow Vial (1:10): Schedule A (10 doses) Red Vial (1:1): Schedule A (14 doses)  Special Instructions: After completion of the first Red Vial, please space to every two weeks. After completion of the second Red Vial, please space to every 4 weeks. Ok to up dose new vials on Schedule D. Ok to come twice weekly, if desired, as long as there is 48 hours between injections.

## 2024-12-08 DIAGNOSIS — J302 Other seasonal allergic rhinitis: Secondary | ICD-10-CM | POA: Diagnosis not present

## 2024-12-08 DIAGNOSIS — J3089 Other allergic rhinitis: Secondary | ICD-10-CM | POA: Diagnosis not present

## 2024-12-08 NOTE — Progress Notes (Signed)
 VIALS MADE ON 12/08/24

## 2024-12-11 DIAGNOSIS — J301 Allergic rhinitis due to pollen: Secondary | ICD-10-CM | POA: Diagnosis not present

## 2024-12-11 DIAGNOSIS — J3081 Allergic rhinitis due to animal (cat) (dog) hair and dander: Secondary | ICD-10-CM | POA: Diagnosis not present

## 2024-12-22 ENCOUNTER — Ambulatory Visit

## 2024-12-22 DIAGNOSIS — J302 Other seasonal allergic rhinitis: Secondary | ICD-10-CM | POA: Diagnosis not present

## 2024-12-22 MED ORDER — EPINEPHRINE 0.3 MG/0.3ML IJ SOAJ: 0.3000 mg | Freq: Once | INTRAMUSCULAR | Status: DC

## 2024-12-22 MED ORDER — CETIRIZINE HCL 10 MG PO TABS: 10.0000 mg | ORAL_TABLET | Freq: Every day | ORAL | 2 refills | Status: AC

## 2024-12-22 MED ORDER — EPINEPHRINE 0.3 MG/0.3ML IJ SOAJ: 0.3000 mg | Freq: Once | INTRAMUSCULAR | Status: AC

## 2024-12-29 ENCOUNTER — Ambulatory Visit: Admitting: *Deleted

## 2024-12-29 DIAGNOSIS — J302 Other seasonal allergic rhinitis: Secondary | ICD-10-CM | POA: Diagnosis not present

## 2025-01-05 ENCOUNTER — Ambulatory Visit (INDEPENDENT_AMBULATORY_CARE_PROVIDER_SITE_OTHER)

## 2025-01-05 DIAGNOSIS — J302 Other seasonal allergic rhinitis: Secondary | ICD-10-CM

## 2025-01-12 ENCOUNTER — Ambulatory Visit (INDEPENDENT_AMBULATORY_CARE_PROVIDER_SITE_OTHER)

## 2025-01-12 DIAGNOSIS — J302 Other seasonal allergic rhinitis: Secondary | ICD-10-CM | POA: Diagnosis not present

## 2025-01-19 ENCOUNTER — Ambulatory Visit

## 2025-01-19 DIAGNOSIS — J302 Other seasonal allergic rhinitis: Secondary | ICD-10-CM | POA: Diagnosis not present

## 2025-05-08 ENCOUNTER — Ambulatory Visit: Admitting: Allergy & Immunology
